# Patient Record
Sex: Male | Born: 1945
Health system: Southern US, Community
[De-identification: ages and names within clinical notes are randomized; demographics above are authoritative.]

## PROBLEM LIST (undated history)

## (undated) DIAGNOSIS — Z974 Presence of external hearing-aid: Secondary | ICD-10-CM

## (undated) DIAGNOSIS — N529 Male erectile dysfunction, unspecified: Secondary | ICD-10-CM

## (undated) DIAGNOSIS — M159 Polyosteoarthritis, unspecified: Secondary | ICD-10-CM

## (undated) DIAGNOSIS — R351 Nocturia: Secondary | ICD-10-CM

## (undated) DIAGNOSIS — N189 Chronic kidney disease, unspecified: Secondary | ICD-10-CM

## (undated) DIAGNOSIS — N400 Enlarged prostate without lower urinary tract symptoms: Secondary | ICD-10-CM

## (undated) DIAGNOSIS — Z973 Presence of spectacles and contact lenses: Secondary | ICD-10-CM

## (undated) DIAGNOSIS — K56609 Unspecified intestinal obstruction, unspecified as to partial versus complete obstruction: Secondary | ICD-10-CM

## (undated) DIAGNOSIS — Z8719 Personal history of other diseases of the digestive system: Secondary | ICD-10-CM

## (undated) DIAGNOSIS — G248 Other dystonia: Secondary | ICD-10-CM

## (undated) DIAGNOSIS — Z87442 Personal history of urinary calculi: Secondary | ICD-10-CM

## (undated) DIAGNOSIS — Z8551 Personal history of malignant neoplasm of bladder: Secondary | ICD-10-CM

## (undated) DIAGNOSIS — K5730 Diverticulosis of large intestine without perforation or abscess without bleeding: Secondary | ICD-10-CM

## (undated) DIAGNOSIS — D494 Neoplasm of unspecified behavior of bladder: Secondary | ICD-10-CM

## (undated) DIAGNOSIS — C801 Malignant (primary) neoplasm, unspecified: Secondary | ICD-10-CM

## (undated) DIAGNOSIS — E785 Hyperlipidemia, unspecified: Secondary | ICD-10-CM

## (undated) DIAGNOSIS — N3289 Other specified disorders of bladder: Secondary | ICD-10-CM

## (undated) DIAGNOSIS — K4090 Unilateral inguinal hernia, without obstruction or gangrene, not specified as recurrent: Secondary | ICD-10-CM

## (undated) HISTORY — DX: Unspecified intestinal obstruction, unspecified as to partial versus complete obstruction: K56.609

## (undated) HISTORY — DX: Malignant (primary) neoplasm, unspecified: C80.1

## (undated) HISTORY — DX: Male erectile dysfunction, unspecified: N52.9

## (undated) HISTORY — DX: Other dystonia: G24.8

## (undated) HISTORY — DX: Personal history of other diseases of the digestive system: Z87.19

## (undated) HISTORY — DX: Chronic kidney disease, unspecified: N18.9

---
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
## 1996-01-16 HISTORY — PX: PARTIAL COLECTOMY: SHX5273

## 2002-01-15 HISTORY — PX: GREEN LIGHT LASER TURP (TRANSURETHRAL RESECTION OF PROSTATE: SHX6260

## 2005-11-15 HISTORY — PX: EXPLORATORY LAPAROTOMY W/ BOWEL RESECTION: SHX1544

## 2011-03-16 HISTORY — PX: OTHER SURGICAL HISTORY: SHX169

## 2012-05-15 HISTORY — PX: TRANSURETHRAL RESECTION OF PROSTATE: SHX73

## 2013-01-29 ENCOUNTER — Ambulatory Visit: Payer: 59 | Admitting: Neurology

## 2013-02-10 ENCOUNTER — Encounter (INDEPENDENT_AMBULATORY_CARE_PROVIDER_SITE_OTHER): Payer: Self-pay

## 2013-02-10 ENCOUNTER — Ambulatory Visit (INDEPENDENT_AMBULATORY_CARE_PROVIDER_SITE_OTHER): Payer: 59 | Admitting: Neurology

## 2013-02-10 ENCOUNTER — Encounter: Payer: Self-pay | Admitting: Neurology

## 2013-02-10 VITALS — BP 135/80 | HR 67 | Temp 98.5°F | Ht 74.25 in | Wt 239.0 lb

## 2013-02-10 DIAGNOSIS — G2589 Other specified extrapyramidal and movement disorders: Secondary | ICD-10-CM

## 2013-02-10 DIAGNOSIS — G248 Other dystonia: Secondary | ICD-10-CM | POA: Insufficient documentation

## 2013-02-10 NOTE — Patient Instructions (Signed)
We will try to get records from Dr. Roger Shelter in [place]. Please fill out a release of healthcare information form today.  Once I have reviewed her records, we may add more testing, for example a brain MRI and a EEG which is a brain wave test. I may also want to add more blood work. For now, we will wait till we have records and we will not start any medication. Down the road, you may be a good candidate for focal botox injections, but I think, your condition is mild enough for watchful waiting at this point.

## 2013-02-10 NOTE — Progress Notes (Signed)
Subjective:    Patient ID: Douglas Holmes is a 68 y.o. male.  HPI   Star Age, MD, PhD [facility] [address] [place], [place] [address]  Dear Dr. Shelia Media,   I saw your patient, [name], upon your kind request in my neurologic clinic today for initial consultation of his abnormal hand position and concerned regarding progressive pronator drift. The patient is unaccompanied today. As you know, [name] is a very pleasant 68 year old right-handed gentleman with an underlying medical history of hyperlipidemia, hearing loss, osteoarthritis, nephrolithiasis, AAA, bladder cancer, BPH, erectile dysfunction, who has had a several year hx of over 5 years of gradual onset and slowly progressive abnormal R arm and hand dysfunction with abnormal positioning. No numbness, no paresthesias, no pain. He has noted, that he has to hold things at a different angle and his penmanship has changed.  Never had TIA or stroke symptoms, denying sudden onset of one sided weakness, numbness, tingling, slurring of speech or droopy face, hearing loss, tinnitus, diplopia or visual field cut or monocular loss of vision, and denies recurrent headaches. No FHx of stroke, no Wilson's, no Huntington's, no dystonia in the family either.  He moved from [place] to [facility] in July 2014 and had seen a Psychologist, sport and exercise and a neurologist in the past in [place], and was checked with [facility] for CTS, which was very mild and not surgical and the neurologist told him initially that he may have dystonia.   His Past Medical History Is Significant For: Past Medical History  Diagnosis Date  . Hypercholesteremia   . ED (erectile dysfunction)   . BPH (benign prostatic hyperplasia)   . Nephrolithiasis   . HOH (hard of hearing)   . Hx SBO   . History of diverticulitis   . Bladder cancer   . Other lesion of median nerve     Pronator syndrome  . Hypertrophy of prostate with urinary obstruction and other lower urinary  tract symptoms (LUTS)   . Generalized osteoarthrosis, unspecified site     His Past Surgical History Is Significant For: Past Surgical History  Procedure Laterality Date  . Cholectomy  1988  . Prostate surgery  05/2012    His Family History Is Significant For: Family History  Problem Relation Age of Onset  . Colon cancer      Family hx  . Dementia Father   . Cancer Mother   . Hypertension Mother   . Heart failure Brother   . Coronary artery disease Brother     His Social History Is Significant For: History   Social History  . Marital Status: Married    Spouse Name: N/A    Number of Children: N/A  . Years of Education: N/A   Social History Main Topics  . Smoking status: Former Research scientist (life sciences)  . Smokeless tobacco: None  . Alcohol Use: 1.5 oz/week    3 drink(s) per week  . Drug Use: No  . Sexual Activity: None   Other Topics Concern  . None   Social History Narrative  . None    His Allergies Are:  No Known Allergies:   His Current Medications Are:  Outpatient Encounter Prescriptions as of 02/10/2013  Medication Sig  . aspirin 81 MG tablet Take 81 mg by mouth daily.  Marland Kitchen atorvastatin (LIPITOR) 20 MG tablet Take 1 tablet by mouth daily.  Marland Kitchen glucosamine-chondroitin 500-400 MG tablet Take 1 tablet by mouth 3 (three) times daily.  Marland Kitchen KRILL OIL PO  Take 1 tablet by mouth daily.  . Multiple Vitamin (MULTIVITAMIN) tablet Take 1 tablet by mouth daily.  : Review of Systems:  Out of a complete 14 point review of systems, all are reviewed and negative with the exception of these symptoms as listed below:   Review of Systems  Constitutional: Negative.   HENT: Positive for hearing loss.   Eyes: Negative.   Respiratory: Negative.   Cardiovascular: Negative.   Gastrointestinal: Negative.   Endocrine: Negative.   Genitourinary: Negative.   Musculoskeletal: Negative.   Skin: Negative.   Allergic/Immunologic: Negative.   Neurological: Negative.   Hematological: Negative.    Psychiatric/Behavioral: Negative.     Objective:  Neurologic Exam  Physical Exam Physical Examination:   Filed Vitals:   02/10/13 1018  BP: 135/80  Pulse: 67  Temp: 98.5 F (36.9 C)    General Examination: The patient is a very pleasant 68 y.o. male in no acute distress. He appears well-developed and well-nourished and well groomed.   HEENT: Normocephalic, atraumatic, pupils are equal, round and reactive to light and accommodation. Funduscopic exam is normal with sharp disc margins noted. Extraocular tracking is good without limitation to gaze excursion or nystagmus noted. Normal smooth pursuit is noted. Hearing is grossly intact, but he states he has hearing aids which he did not bring today. Tympanic membranes are clear bilaterally. Face is symmetric with normal facial animation and normal facial sensation. Speech is clear with no dysarthria noted. There is no hypophonia. There is no lip, neck/head, jaw or voice tremor. Neck is supple with full range of passive and active motion. There are no carotid bruits on auscultation. Oropharynx exam reveals: mild mouth dryness, adequate dental hygiene and moderate airway crowding, due to thick soft palate and tonsillar size. Mallampati is class II. Tongue protrudes centrally and palate elevates symmetrically.   Chest: Clear to auscultation without wheezing, rhonchi or crackles noted.  Heart: S1+S2+0, regular and normal without murmurs, rubs or gallops noted.   Abdomen: Soft, non-tender and non-distended with normal bowel sounds appreciated on auscultation.  Extremities: There is no pitting edema in the distal lower extremities bilaterally. Pedal pulses are intact.  Skin: Warm and dry without trophic changes noted. There are no varicose veins.  Musculoskeletal: exam reveals no obvious joint deformities, tenderness or joint swelling or erythema.   Neurologically:  Mental status: The patient is awake, alert and oriented in all 4 spheres. His  memory, attention, language and Douglas are appropriate. There is no aphasia, agnosia, apraxia or anomia. Speech is clear with normal prosody and enunciation. Thought process is linear. Mood is congruent and affect is normal.  Cranial nerves are as described above under HEENT exam. In addition, shoulder shrug is normal with equal shoulder height noted. Motor exam: Normal bulk, strength and tone is noted, with the exception of a very slight increase in tone in the right wrist and right elbow area. He has slight difficulty with wrist extension on the right and with handwriting testing he has excessive risk flexion and finger flexion and abnormal positioning of his patent. There is a slight pronator drift, no tremor or rebound. There is no myoclonus, athetosis, or choreiform movements. Romberg is negative. Reflexes are 2+ throughout. Toes are downgoing bilaterally. Fine motor skills are intact with normal finger taps, normal hand movements, normal rapid alternating patting, normal foot taps and normal foot agility.  Cerebellar testing shows no dysmetria or intention tremor on finger to nose testing. Heel to shin is unremarkable bilaterally. There is  no truncal or gait ataxia.  Sensory exam is intact to light touch, pinprick, vibration, temperature sense in the upper and lower extremities.  Gait, station and balance are unremarkable. No veering to one side is noted. No leaning to one side is noted. Posture is age-appropriate and stance is narrow based. No problems turning are noted. He turns en bloc. Tandem walk is unremarkable. Intact toe and heel stance is noted.               Assessment and Plan:   In summary, Jamerion Cabello is a very pleasant 68 y.o.-year old male with a history of HLP, who reports an over five-year history of abnormal right arm and hand positioning, and change in posture and handwriting. 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 He  has otherwise a normal and nonfocal neurological in general exam. I reassured him in that regard. He has a very mild presentation and I think it is unlikely that he had a stroke or any other progressive neurological disorder. He does not have any family history of a movement disorder. Nevertheless, he would probably benefit from further testing in the form of brain MRI with contrast, EEG and blood work to include ceruloplasmin, copper level, thyroid function, RPR, ESR, CRP, ANA. However, he did have some blood work and some testing previously when he saw a neurologist in [place]. To that end, at this time, we will request records from Dr. Roger Shelter in [place], [place]. He will sign a release of healthcare information form on his way out today. I talked to him about focal dystonia at length today. I explained to him that there is no cure and oral symptomatic medications often cause side effects before they help for the focal problem. Considerations are benzodiazepines or a muscle relaxant. Localized Botox injection may be a good option for him down the road but at this time I think his symptomatology is quite mild so that watchful waiting may be the way to go at this time. Once I have reviewed his records I may add more testing and he can come in and get those tests done. I can see him back on an as-needed basis at this point. He was in agreement. I answered all his questions today and the patient was agreeable to the plan. Thank you very much for allowing me to participate in the care of this nice patient. If I can be of any further assistance to you please do not hesitate to call me at [phone].  Sincerely,   Star Age, MD, PhD

## 2013-04-21 DIAGNOSIS — Z8551 Personal history of malignant neoplasm of bladder: Secondary | ICD-10-CM | POA: Diagnosis not present

## 2013-04-21 DIAGNOSIS — R972 Elevated prostate specific antigen [PSA]: Secondary | ICD-10-CM | POA: Diagnosis not present

## 2013-04-21 DIAGNOSIS — N529 Male erectile dysfunction, unspecified: Secondary | ICD-10-CM | POA: Diagnosis not present

## 2013-07-21 DIAGNOSIS — R972 Elevated prostate specific antigen [PSA]: Secondary | ICD-10-CM | POA: Diagnosis not present

## 2013-07-28 DIAGNOSIS — Z8551 Personal history of malignant neoplasm of bladder: Secondary | ICD-10-CM | POA: Diagnosis not present

## 2013-07-28 DIAGNOSIS — R972 Elevated prostate specific antigen [PSA]: Secondary | ICD-10-CM | POA: Diagnosis not present

## 2013-08-21 DIAGNOSIS — Z0000 Encounter for general adult medical examination without abnormal findings: Secondary | ICD-10-CM | POA: Diagnosis not present

## 2013-08-21 DIAGNOSIS — E7800 Pure hypercholesterolemia, unspecified: Secondary | ICD-10-CM | POA: Diagnosis not present

## 2013-08-21 DIAGNOSIS — Z125 Encounter for screening for malignant neoplasm of prostate: Secondary | ICD-10-CM | POA: Diagnosis not present

## 2013-08-21 DIAGNOSIS — N400 Enlarged prostate without lower urinary tract symptoms: Secondary | ICD-10-CM | POA: Diagnosis not present

## 2013-08-27 DIAGNOSIS — N200 Calculus of kidney: Secondary | ICD-10-CM | POA: Diagnosis not present

## 2013-08-27 DIAGNOSIS — M159 Polyosteoarthritis, unspecified: Secondary | ICD-10-CM | POA: Diagnosis not present

## 2013-08-27 DIAGNOSIS — N529 Male erectile dysfunction, unspecified: Secondary | ICD-10-CM | POA: Diagnosis not present

## 2013-08-27 DIAGNOSIS — E7800 Pure hypercholesterolemia, unspecified: Secondary | ICD-10-CM | POA: Diagnosis not present

## 2013-09-07 DIAGNOSIS — Z23 Encounter for immunization: Secondary | ICD-10-CM | POA: Diagnosis not present

## 2013-11-03 DIAGNOSIS — C679 Malignant neoplasm of bladder, unspecified: Secondary | ICD-10-CM | POA: Diagnosis not present

## 2013-11-03 DIAGNOSIS — R351 Nocturia: Secondary | ICD-10-CM | POA: Diagnosis not present

## 2013-11-04 ENCOUNTER — Other Ambulatory Visit: Payer: Self-pay | Admitting: Urology

## 2013-11-16 NOTE — H&P (Signed)
Douglas Holmes is a 68 year old male with a history of bladder cancer.   History of Present Illness         History of bladder cancer: He was incidentally noted to have a bladder tumor at the time of the TURP. TURBT - 5/14. Pathology: Papillary, single (Ta,G1).     BPH with outlet obstruction: He was managed initially with maximum medical management using Proscar and Uroxatral. He underwent interstitial laser coagulation in '04. He underwent a TURP in 5/14 which resulted in significant improvement in his voiding symptoms.     History of nephrolithiasis: He has a history of calculus disease and underwent ureteroscopy for a left distal ureteral stone in 3/13. 24-hour urine - elevated urine uric acid, sodium and low volume.     Erectile dysfunction: This has been managed with Viagra 100 mg.     Rising PSA: His PSA increased from 0.9 in 2013 to 2.4 in 1/15. A rise of 1.5 (~0.75/year).     Interval history: He reports that he has been having some nocturia but otherwise is not having any new voiding complaints. His nocturia is not significant enough that he would want to try any form of medication for this.   Past Medical History Problems  1. History of hypercholesterolemia (Z86.39) 2. History of renal calculi (Z87.442) 3. Malignant neoplasm of urinary bladder (C67.9)  Surgical History Problems  1. History of Colotomy 2. History of Intestinal Surgery 3. History of Laser Coagulation Of Prostate 4. Prostate Surgery 5. History of Transurethral Resection Of Prostate (TURP)  Current Meds 1. Aspirin 81 MG Oral Tablet;  Therapy: (Recorded:05Jan2015) to Recorded 2. Glucosamine TABS;  Therapy: (Recorded:05Jan2015) to Recorded 3. Lipitor 20 MG Oral Tablet;  Therapy: (Recorded:05Jan2015) to Recorded 4. Multi-Vitamin TABS;  Therapy: (Recorded:05Jan2015) to Recorded 5. Omega-3 Krill Oil CAPS;  Therapy: (Recorded:05Jan2015) to Recorded 6. Vitamin D3 CAPS;  Therapy:  (Recorded:05Jan2015) to Recorded  Allergies Medication  1. No Known Drug Allergies  Family History Problems  1. Family history of CAD (coronary artery disease) : Brother 2. Family history of Deceased : Father, Mother 48. Family history of malignant neoplasm (Z80.9) : Mother  Social History Problems  1. Alcohol use   occasional 1 drink  three a week 2. Caffeine use (F15.90)   1-2 cups a day 3. Former smoker [phone]) 4. Married 5. Number of children   2 sons and 1 daughter 31. Retired  Review of Systems Genitourinary, constitutional, skin, eye, otolaryngeal, hematologic/lymphatic, cardiovascular, pulmonary, endocrine, musculoskeletal, gastrointestinal, neurological and psychiatric system(s) were reviewed and pertinent findings if present are noted.    Vitals Vital Signs Height: 6 ft 2 in Weight: 240 lb  BMI Calculated: 30.81 BSA Calculated: 2.35 Blood Pressure: 120 / 77 Temperature: 97.8 F Heart Rate: 71  Physical Exam Constitutional: Well nourished and well developed . No acute distress.  ENT:. The ears and nose are normal in appearance.  Neck: The appearance of the neck is normal and no neck mass is present.  Pulmonary: No respiratory distress and normal respiratory rhythm and effort.  Cardiovascular: Heart rate and rhythm are normal . No peripheral edema.  Abdomen: The abdomen is soft and nontender. No masses are palpated. No CVA tenderness. No hernias are palpable. No hepatosplenomegaly noted.  Rectal: Rectal exam demonstrates normal sphincter tone, no tenderness and no masses. The prostate has no nodularity and is not tender. The left seminal vesicle is nonpalpable. The right seminal vesicle is nonpalpable. The perineum is normal on inspection.  Genitourinary: Examination of  the penis demonstrates no discharge, no masses, no lesions and a normal meatus. The scrotum is without lesions. The right epididymis is palpably normal and non-tender. The left epididymis is  palpably normal and non-tender. The right testis is non-tender and without masses. The left testis is non-tender and without masses.  Lymphatics: The femoral and inguinal nodes are not enlarged or tender.  Skin: Normal skin turgor, no visible rash and no visible skin lesions.  Neuro/Psych:. Mood and affect are appropriate.      Procedure: Cystoscopy   Indication: History of Urothelial Carcinoma.  Informed Consent: Risks, benefits, and potential adverse events were discussed and informed consent was obtained from the patient.  Prep: The patient was prepped with betadine.  Anesthesia:. Local anesthesia was administered intraurethrally with 2% lidocaine jelly.  Procedure Note:  Urethral meatus:. No abnormalities.  Anterior urethra: No abnormalities.  Prostatic urethra:. There is evidence of resection with no bladder neck contracture but there was still some prostatic tissue present near the apex.  Bladder: Visulization was clear. The ureteral orifices were in the normal anatomic position bilaterally and had clear efflux of urine. Examination of the bladder demonstrated moderate trabeculation cellules. A solitary tumor was visualized in the bladder. A papillary tumor was seen in the bladder. This tumor was located on the right side, at the base of the bladder. The patient tolerated the procedure well.  Complications: None.      A papillary recurrence was noted on the floor of the bladder on the right-hand side posterior to the ureteral orifice. We discussed the need for resection of the tumor and I have gone over the procedure again with him including its risks and complications, the probability of success, the outpatient nature of the procedure as well as the anticipated postoperative course. He understands and has elected to proceed.        Plan: He is scheduled for transurethral resection of his bladder tumor.

## 2013-11-19 ENCOUNTER — Encounter (HOSPITAL_BASED_OUTPATIENT_CLINIC_OR_DEPARTMENT_OTHER): Payer: Self-pay | Admitting: *Deleted

## 2013-11-19 NOTE — Anesthesia Preprocedure Evaluation (Addendum)
Anesthesia Evaluation  Patient identified by MRN, date of birth, ID band Patient awake    Reviewed: Allergy & Precautions, H&P , NPO status , Patient's Chart, lab work & pertinent test results  History of Anesthesia Complications Negative for: history of anesthetic complications  Airway Mallampati: II  TM Distance: >3 FB Neck ROM: Full    Dental no notable dental hx. (+) Teeth Intact, Dental Advisory Given   Pulmonary former smoker,  breath sounds clear to auscultation  Pulmonary exam normal       Cardiovascular Exercise Tolerance: Good negative cardio ROS  Rhythm:Regular Rate:Normal     Neuro/Psych negative neurological ROS  negative psych ROS   GI/Hepatic negative GI ROS, Neg liver ROS,   Endo/Other  negative endocrine ROS  Renal/GU negative Renal ROS Bladder dysfunction      Musculoskeletal  (+) Arthritis -, Osteoarthritis,    Abdominal   Peds negative pediatric ROS (+)  Hematology negative hematology ROS (+)   Anesthesia Other Findings   Reproductive/Obstetrics negative OB ROS                            Anesthesia Physical Anesthesia Plan  ASA: II  Anesthesia Plan: General   Post-op Pain Management:    Induction: Intravenous  Airway Management Planned: LMA  Additional Equipment:   Intra-op Plan:   Post-operative Plan: Extubation in OR  Informed Consent: I have reviewed the patients History and Physical, chart, labs and discussed the procedure including the risks, benefits and alternatives for the proposed anesthesia with the patient or authorized representative who has indicated his/her understanding and acceptance.   Dental advisory given  Plan Discussed with: CRNA  Anesthesia Plan Comments:         Anesthesia Quick Evaluation

## 2013-11-19 NOTE — Progress Notes (Signed)
NPO AFTER MN.  ARRIVE AT 0830.  NEEDS HG. 

## 2013-11-20 ENCOUNTER — Ambulatory Visit (HOSPITAL_BASED_OUTPATIENT_CLINIC_OR_DEPARTMENT_OTHER): Payer: Medicare Other | Admitting: Anesthesiology

## 2013-11-20 ENCOUNTER — Encounter (HOSPITAL_BASED_OUTPATIENT_CLINIC_OR_DEPARTMENT_OTHER): Payer: Self-pay | Admitting: *Deleted

## 2013-11-20 ENCOUNTER — Ambulatory Visit (HOSPITAL_BASED_OUTPATIENT_CLINIC_OR_DEPARTMENT_OTHER)
Admission: RE | Admit: 2013-11-20 | Discharge: 2013-11-20 | Disposition: A | Discharge status: Home / Self Care | Payer: Medicare Other | Source: Ambulatory Visit | Attending: Urology | Admitting: Urology

## 2013-11-20 ENCOUNTER — Encounter (HOSPITAL_BASED_OUTPATIENT_CLINIC_OR_DEPARTMENT_OTHER)
Admission: RE | Disposition: A | Discharge status: Home / Self Care | Payer: Self-pay | Source: Ambulatory Visit | Attending: Urology

## 2013-11-20 DIAGNOSIS — N401 Enlarged prostate with lower urinary tract symptoms: Secondary | ICD-10-CM | POA: Diagnosis not present

## 2013-11-20 DIAGNOSIS — M1990 Unspecified osteoarthritis, unspecified site: Secondary | ICD-10-CM | POA: Diagnosis not present

## 2013-11-20 DIAGNOSIS — Z87891 Personal history of nicotine dependence: Secondary | ICD-10-CM | POA: Diagnosis not present

## 2013-11-20 DIAGNOSIS — C679 Malignant neoplasm of bladder, unspecified: Secondary | ICD-10-CM | POA: Insufficient documentation

## 2013-11-20 DIAGNOSIS — N138 Other obstructive and reflux uropathy: Secondary | ICD-10-CM | POA: Diagnosis not present

## 2013-11-20 DIAGNOSIS — E780 Pure hypercholesterolemia: Secondary | ICD-10-CM | POA: Insufficient documentation

## 2013-11-20 DIAGNOSIS — D494 Neoplasm of unspecified behavior of bladder: Secondary | ICD-10-CM | POA: Diagnosis present

## 2013-11-20 HISTORY — DX: Polyosteoarthritis, unspecified: M15.9

## 2013-11-20 HISTORY — DX: Presence of external hearing-aid: Z97.4

## 2013-11-20 HISTORY — PX: TRANSURETHRAL RESECTION OF BLADDER TUMOR: SHX2575

## 2013-11-20 HISTORY — DX: Benign prostatic hyperplasia without lower urinary tract symptoms: N40.0

## 2013-11-20 HISTORY — DX: Neoplasm of unspecified behavior of bladder: D49.4

## 2013-11-20 HISTORY — DX: Hyperlipidemia, unspecified: E78.5

## 2013-11-20 HISTORY — DX: Personal history of malignant neoplasm of bladder: Z85.51

## 2013-11-20 HISTORY — DX: Diverticulosis of large intestine without perforation or abscess without bleeding: K57.30

## 2013-11-20 HISTORY — DX: Personal history of urinary calculi: Z87.442

## 2013-11-20 LAB — POCT HEMOGLOBIN-HEMACUE: Hemoglobin: 15 g/dL (ref 13.0–17.0)

## 2013-11-20 SURGERY — TURBT (TRANSURETHRAL RESECTION OF BLADDER TUMOR)
Anesthesia: General | Site: Bladder

## 2013-11-20 MED ORDER — STERILE WATER FOR IRRIGATION IR SOLN
Status: DC | PRN
  Administered 2013-11-20: 500 mL via INTRAVESICAL

## 2013-11-20 MED ORDER — SODIUM CHLORIDE 0.9 % IR SOLN
Status: DC | PRN
  Administered 2013-11-20: 6000 mL via INTRAVESICAL

## 2013-11-20 MED ORDER — CIPROFLOXACIN IN D5W 200 MG/100ML IV SOLN
200.0000 mg | INTRAVENOUS | Status: AC
  Administered 2013-11-20: 200 mg via INTRAVENOUS
  Filled 2013-11-20: qty 100

## 2013-11-20 MED ORDER — ONDANSETRON HCL 4 MG/2ML IJ SOLN
4.0000 mg | Freq: Once | INTRAMUSCULAR | Status: DC | PRN
  Filled 2013-11-20: qty 2

## 2013-11-20 MED ORDER — FENTANYL CITRATE 0.05 MG/ML IJ SOLN
25.0000 ug | INTRAMUSCULAR | Status: DC | PRN
  Filled 2013-11-20: qty 1

## 2013-11-20 MED ORDER — PROPOFOL 10 MG/ML IV BOLUS
INTRAVENOUS | Status: DC | PRN
  Administered 2013-11-20: 200 mg via INTRAVENOUS

## 2013-11-20 MED ORDER — LIDOCAINE HCL (CARDIAC) 20 MG/ML IV SOLN
INTRAVENOUS | Status: DC | PRN
  Administered 2013-11-20: 50 mg via INTRAVENOUS

## 2013-11-20 MED ORDER — PHENAZOPYRIDINE HCL 200 MG PO TABS: 200.0000 mg | ORAL_TABLET | Freq: Three times a day (TID) | ORAL | Status: DC | PRN

## 2013-11-20 MED ORDER — CIPROFLOXACIN IN D5W 200 MG/100ML IV SOLN
INTRAVENOUS | Status: AC
  Filled 2013-11-20: qty 100

## 2013-11-20 MED ORDER — FENTANYL CITRATE 0.05 MG/ML IJ SOLN
INTRAMUSCULAR | Status: AC
  Filled 2013-11-20: qty 4

## 2013-11-20 MED ORDER — PHENAZOPYRIDINE HCL 200 MG PO TABS
200.0000 mg | ORAL_TABLET | Freq: Once | ORAL | Status: AC
  Administered 2013-11-20: 200 mg via ORAL
  Filled 2013-11-20: qty 1

## 2013-11-20 MED ORDER — HYDROCODONE-ACETAMINOPHEN 7.5-325 MG PO TABS: 1.0000 | ORAL_TABLET | ORAL | Status: DC | PRN

## 2013-11-20 MED ORDER — LACTATED RINGERS IV SOLN
INTRAVENOUS | Status: DC
Start: 2013-11-20 — End: 2013-11-20
  Administered 2013-11-20: 09:00:00 via INTRAVENOUS
  Filled 2013-11-20: qty 1000

## 2013-11-20 MED ORDER — MIDAZOLAM HCL 2 MG/2ML IJ SOLN
INTRAMUSCULAR | Status: AC
  Filled 2013-11-20: qty 2

## 2013-11-20 MED ORDER — FENTANYL CITRATE 0.05 MG/ML IJ SOLN
INTRAMUSCULAR | Status: DC | PRN
  Administered 2013-11-20: 100 ug via INTRAVENOUS

## 2013-11-20 MED ORDER — DEXAMETHASONE SODIUM PHOSPHATE 4 MG/ML IJ SOLN
INTRAMUSCULAR | Status: DC | PRN
  Administered 2013-11-20: 10 mg via INTRAVENOUS

## 2013-11-20 MED ORDER — PHENAZOPYRIDINE HCL 100 MG PO TABS
ORAL_TABLET | ORAL | Status: AC
  Filled 2013-11-20: qty 2

## 2013-11-20 MED ORDER — MIDAZOLAM HCL 5 MG/5ML IJ SOLN
INTRAMUSCULAR | Status: DC | PRN
  Administered 2013-11-20: 2 mg via INTRAVENOUS

## 2013-11-20 SURGICAL SUPPLY — 34 items
BAG DRAIN URO-CYSTO SKYTR STRL (DRAIN) ×2 IMPLANT
BAG URINE DRAINAGE (UROLOGICAL SUPPLIES) IMPLANT
BAG URINE LEG 19OZ MD ST LTX (BAG) IMPLANT
CANISTER SUCT LVC 12 LTR MEDI- (MISCELLANEOUS) ×2 IMPLANT
CATH FOLEY 2WAY SLVR  5CC 20FR (CATHETERS)
CATH FOLEY 2WAY SLVR  5CC 22FR (CATHETERS)
CATH FOLEY 2WAY SLVR  5CC 24FR (CATHETERS)
CATH FOLEY 2WAY SLVR 5CC 20FR (CATHETERS) IMPLANT
CATH FOLEY 2WAY SLVR 5CC 22FR (CATHETERS) IMPLANT
CATH FOLEY 2WAY SLVR 5CC 24FR (CATHETERS) IMPLANT
CATH FOLEY 3WAY 20FR (CATHETERS) IMPLANT
CLOTH BEACON ORANGE TIMEOUT ST (SAFETY) ×2 IMPLANT
DRAPE CAMERA CLOSED 9X96 (DRAPES) ×2 IMPLANT
ELECT BUTTON HF 24-28F 2 30DE (ELECTRODE) IMPLANT
ELECT LOOP MED HF 24F 12D (CUTTING LOOP) IMPLANT
ELECT REM PT RETURN 9FT ADLT (ELECTROSURGICAL) ×2
ELECT RESECT VAPORIZE 12D CBL (ELECTRODE) IMPLANT
ELECTRODE REM PT RTRN 9FT ADLT (ELECTROSURGICAL) ×1 IMPLANT
EVACUATOR MICROVAS BLADDER (UROLOGICAL SUPPLIES) IMPLANT
GLOVE BIO SURGEON STRL SZ 6.5 (GLOVE) ×2 IMPLANT
GLOVE BIO SURGEON STRL SZ8 (GLOVE) ×2 IMPLANT
GLOVE INDICATOR 7.0 STRL GRN (GLOVE) ×2 IMPLANT
GOWN PREVENTION PLUS LG XLONG (DISPOSABLE) IMPLANT
GOWN STRL REIN XL XLG (GOWN DISPOSABLE) IMPLANT
GOWN STRL REUS W/ TWL LRG LVL3 (GOWN DISPOSABLE) ×1 IMPLANT
GOWN STRL REUS W/ TWL XL LVL3 (GOWN DISPOSABLE) ×1 IMPLANT
GOWN STRL REUS W/TWL LRG LVL3 (GOWN DISPOSABLE) ×1
GOWN STRL REUS W/TWL XL LVL3 (GOWN DISPOSABLE) ×1
HOLDER FOLEY CATH W/STRAP (MISCELLANEOUS) IMPLANT
IV NS IRRIG 3000ML ARTHROMATIC (IV SOLUTION) ×4 IMPLANT
PACK CYSTO (CUSTOM PROCEDURE TRAY) ×2 IMPLANT
PLUG CATH AND CAP STER (CATHETERS) IMPLANT
SET ASPIRATION TUBING (TUBING) IMPLANT
WATER STERILE IRR 3000ML UROMA (IV SOLUTION) IMPLANT

## 2013-11-20 NOTE — Anesthesia Procedure Notes (Signed)
Procedure Name: LMA Insertion Date/Time: 11/20/2013 9:58 AM Performed by: Bethena Roys T Pre-anesthesia Checklist: Patient identified, Emergency Drugs available, Suction available and Patient being monitored Patient Re-evaluated:Patient Re-evaluated prior to inductionOxygen Delivery Method: Circle System Utilized Preoxygenation: Pre-oxygenation with 100% oxygen Intubation Type: IV induction Ventilation: Mask ventilation without difficulty LMA: LMA inserted LMA Size: 5.0 Number of attempts: 1 Airway Equipment and Method: bite block Placement Confirmation: positive ETCO2 Tube secured with: Tape Dental Injury: Teeth and Oropharynx as per pre-operative assessment

## 2013-11-20 NOTE — H&P (Signed)
Douglas Holmes is a 68 year old male with a history of bladder cancer.   History of Present Illness         History of bladder cancer: He was incidentally noted to have a bladder tumor at the time of the TURP. TURBT - 5/14. Pathology: Papillary, single (Ta,G1).     BPH with outlet obstruction: He was managed initially with maximum medical management using Proscar and Uroxatral. He underwent interstitial laser coagulation in '04. He underwent a TURP in 5/14 which resulted in significant improvement in his voiding symptoms.     History of nephrolithiasis: He has a history of calculus disease and underwent ureteroscopy for a left distal ureteral stone in 3/13. 24-hour urine - elevated urine uric acid, sodium and low volume.     Erectile dysfunction: This has been managed with Viagra 100 mg.     Rising PSA: His PSA increased from 0.9 in 2013 to 2.4 in 1/15. A rise of 1.5 (~0.75/year).     Interval history: He reports that he has been having some nocturia but otherwise is not having any new voiding complaints. His nocturia is not significant enough that he would want to try any form of medication for this.   Past Medical History Problems  1. History of hypercholesterolemia (Z86.39) 2. History of renal calculi (Z87.442) 3. Malignant neoplasm of urinary bladder (C67.9)  Surgical History Problems  1. History of Colotomy 2. History of Intestinal Surgery 3. History of Laser Coagulation Of Prostate 4. Prostate Surgery 5. History of Transurethral Resection Of Prostate (TURP)  Current Meds 1. Aspirin 81 MG Oral Tablet;  Therapy: (Recorded:05Jan2015) to Recorded 2. Glucosamine TABS;  Therapy: (Recorded:05Jan2015) to Recorded 3. Lipitor 20 MG Oral Tablet;  Therapy: (Recorded:05Jan2015) to Recorded 4. Multi-Vitamin TABS;  Therapy: (Recorded:05Jan2015) to Recorded 5. Omega-3 Krill Oil CAPS;  Therapy: (Recorded:05Jan2015) to Recorded 6. Vitamin D3 CAPS;  Therapy:  (Recorded:05Jan2015) to Recorded  Allergies Medication  1. No Known Drug Allergies  Family History Problems  1. Family history of CAD (coronary artery disease) : Brother 2. Family history of Deceased : Father, Mother 51. Family history of malignant neoplasm (Z80.9) : Mother  Social History Problems  1. Alcohol use   occasional 1 drink  three a week 2. Caffeine use (F15.90)   1-2 cups a day 3. Former smoker [phone]) 4. Married 5. Number of children   2 sons and 1 daughter 16. Retired  Review of Systems Genitourinary, constitutional, skin, eye, otolaryngeal, hematologic/lymphatic, cardiovascular, pulmonary, endocrine, musculoskeletal, gastrointestinal, neurological and psychiatric system(s) were reviewed and pertinent findings if present are noted.    Vitals Vital Signs Height: 6 ft 2 in Weight: 240 lb  BMI Calculated: 30.81 BSA Calculated: 2.35 Blood Pressure: 96 / 60 Temperature: 98.3 F Heart Rate: 77  Physical Exam Constitutional: Well nourished and well developed . No acute distress.  ENT:. The ears and nose are normal in appearance.  Neck: The appearance of the neck is normal and no neck mass is present.  Pulmonary: No respiratory distress and normal respiratory rhythm and effort.  Cardiovascular: Heart rate and rhythm are normal . No peripheral edema.  Abdomen: The abdomen is soft and nontender. No masses are palpated. No CVA tenderness. No hernias are palpable. No hepatosplenomegaly noted.  Rectal: Rectal exam demonstrates normal sphincter tone, no tenderness and no masses. The prostate has no nodularity and is not tender. The left seminal vesicle is nonpalpable. The right seminal vesicle is nonpalpable. The perineum is normal on inspection.  Genitourinary: Examination  of the penis demonstrates no discharge, no masses, no lesions and a normal meatus. The scrotum is without lesions. The right epididymis is palpably normal and non-tender. The left epididymis is  palpably normal and non-tender. The right testis is non-tender and without masses. The left testis is non-tender and without masses.  Lymphatics: The femoral and inguinal nodes are not enlarged or tender.  Skin: Normal skin turgor, no visible rash and no visible skin lesions.  Neuro/Psych:. Mood and affect are appropriate.   Procedure  Procedure: Cystoscopy done 11/03/13  Indication: History of Urothelial Carcinoma.  Informed Consent: Risks, benefits, and potential adverse events were discussed and informed consent was obtained from the patient.  Prep: The patient was prepped with betadine.  Anesthesia:. Local anesthesia was administered intraurethrally with 2% lidocaine jelly.  Procedure Note:  Urethral meatus:. No abnormalities.  Anterior urethra: No abnormalities.  Prostatic urethra:. There is evidence of resection with no bladder neck contracture but there was still some prostatic tissue present near the apex.  Bladder: Visulization was clear. The ureteral orifices were in the normal anatomic position bilaterally and had clear efflux of urine. Examination of the bladder demonstrated moderate trabeculation cellules. A solitary tumor was visualized in the bladder. A papillary tumor was seen in the bladder. This tumor was located on the right side, at the base of the bladder. The patient tolerated the procedure well.  Complications: None.    Assessment  A papillary recurrence was noted on the floor of the bladder on the right-hand side posterior to the ureteral orifice. We discussed the need for resection of the tumor and I have gone over the procedure again with him including its risks and complications, the probability of success, the outpatient nature of the procedure as well as the anticipated postoperative course. He understands and has elected to proceed.     Plan  He will be scheduled for transurethral resection of his bladder tumor.

## 2013-11-20 NOTE — Op Note (Signed)
PATIENT:  Douglas Holmes  PRE-OPERATIVE DIAGNOSIS: Bladder tumor  POST-OPERATIVE DIAGNOSIS: Same  PROCEDURE:  Procedure(s): TRANSURETHRAL RESECTION OF BLADDER TUMOR (TURBT) (0.5cm.)  SURGEON:  Surgeon(s): Claybon Jabs  ANESTHESIA:   General  EBL:  none  DRAINS: none  SPECIMEN:  Source of Specimen:  Bladder tumor  DISPOSITION OF SPECIMEN:  PATHOLOGY  Indication: [name] is a 68 year old malewith a history of transitional cell carcinoma of the bladder. His original pathology was TA, G1 at the time of resection in 5/14. He presented for routine surveillance cystoscopy and was found to have a recurrence. The recurrence was papillary and appeared to be located very close to the right ureteral orifice. The concern was that office fulguration might result in some scarring and compromise of the ureteral orifice and therefore he is brought to the operating room for a more controlled removal of the recurrence.  Description of operation: The patient was taken to the operating room and administered general anesthesia. He was then placed on the table and moved to the dorsal lithotomy position after which his genitalia was sterilely prepped and draped. An official timeout was then performed.  The 28 French cystoscope was passed under direct vision down the urethra which is noted be normal. The prostatic urethra was noted to be slightly elongated with bilobar hypertrophy but no lesions were noted. Upon entering the bladder it was fully and systematically inspected. Ureteral orifices were noted to be of normal configuration and position. A single papillary tumor was identified and as I filled the bladder more that I had in the office the tumor moved away from the right ureteral orifice and was noted to be located well away from the orifice on the floor of the bladder behind the orifice on the right side.  The cold cup biopsy forceps were then used to obtain a biopsy of the lesion. I then used the  Bugbee electrode to fulgurate the location and  The area around the biopsy site. The bladder was then drained and the patient was awakened and taken recovery room in stable and satisfactory condition. He tolerated procedure well with no intraoperative complications.  PLAN OF CARE: Discharge to home after PACU  PATIENT DISPOSITION:  PACU - hemodynamically stable.

## 2013-11-20 NOTE — Transfer of Care (Signed)
Immediate Anesthesia Transfer of Care Note  Patient: Douglas Holmes  Procedure(s) Performed: Procedure(s): TRANSURETHRAL RESECTION OF BLADDER TUMOR (TURBT) (N/A)  Patient Location: PACU  Anesthesia Type:General  Level of Consciousness: sedated and responds to stimulation  Airway & Oxygen Therapy: Patient Spontanous Breathing and Patient connected to nasal cannula oxygen  Post-op Assessment: Report given to PACU RN  Post vital signs: Reviewed and stable  Complications: No apparent anesthesia complications

## 2013-11-20 NOTE — Discharge Instructions (Signed)
Transurethral Resection of Bladder Tumor (TURBT)   Definition:  Transurethral Resection of the Bladder Tumor is a surgical procedure used to diagnose and remove tumors within the bladder. TURBT is the most common treatment for early stage bladder cancer.  General instructions:     Your recent bladder surgery requires very little post hospital care but some definite precautions.  Despite the fact that no skin incisions were used, the area around the bladder incisions are raw and covered with scabs to promote healing and prevent bleeding. Certain precautions are needed to insure that the scabs are not disturbed over the next 2-4 weeks while the healing proceeds.  Because the raw surface inside your bladder and the irritating effects of urine you may expect frequency of urination and/or urgency (a stronger desire to urinate) and perhaps even getting up at night more often. This will usually resolve or improve slowly over the healing period. You may see some blood in your urine over the first 6 weeks. Do not be alarmed, even if the urine was clear for a while. Get off your feet and drink lots of fluids until clearing occurs. If you start to pass clots or don't improve call us.  Catheter: (If you are discharged with a catheter.)  1. Keep your catheter secured to your leg at all times with tape or the supplied strap. 2. You may experience leakage of urine around your catheter- as long as the  catheter continues to drain, this is normal.  If your catheter stops draining  go to the ER. 3. You may also have blood in your urine, even after it has been clear for  several days; you may even pass some small blood clots or other material.  This  is normal as well.  If this happens, sit down and drink plenty of water to help  make urine to flush out your bladder.  If the blood in your urine becomes worse  after doing this, contact our office or return to the ER. 4. You may use the leg bag (small bag)  during the day, but use the large bag at  night.  Diet:  You may return to your normal diet immediately. Because of the raw surface of your bladder, alcohol, spicy foods, foods high in acid and drinks with caffeine may cause irritation or frequency and should be used in moderation. To keep your urine flowing freely and avoid constipation, drink plenty of fluids during the day (8-10 glasses). Tip: Avoid cranberry juice because it is very acidic.  Activity:  Your physical activity doesn't need to be restricted. However, if you are very active, you may see some blood in the urine. We suggest that you reduce your activity under the circumstances until the bleeding has stopped.  Bowels:  It is important to keep your bowels regular during the postoperative period. Straining with bowel movements can cause bleeding. A bowel movement every other day is reasonable. Use a mild laxative if needed, such as milk of magnesia 2-3 tablespoons, or 2 Dulcolax tablets. Call if you continue to have problems. If you had been taking narcotics for pain, before, during or after your surgery, you may be constipated. Take a laxative if necessary.    Medication:  You should resume your pre-surgery medications unless told not to. In addition you may be given an antibiotic to prevent or treat infection. Antibiotics are not always necessary. All medication should be taken as prescribed until the bottles are finished unless you are having  an unusual reaction to one of the drugs.    Post Anesthesia Home Care Instructions  Activity: Get plenty of rest for the remainder of the day. A responsible adult should stay with you for 24 hours following the procedure.  For the next 24 hours, DO NOT: -Drive a car -Operate machinery -Drink alcoholic beverages -Take any medication unless instructed by your physician -Make any legal decisions or sign important papers.  Meals: Start with liquid foods such as gelatin or soup.  Progress to regular foods as tolerated. Avoid greasy, spicy, heavy foods. If nausea and/or vomiting occur, drink only clear liquids until the nausea and/or vomiting subsides. Call your physician if vomiting continues.  Special Instructions/Symptoms: Your throat may feel dry or sore from the anesthesia or the breathing tube placed in your throat during surgery. If this causes discomfort, gargle with warm salt water. The discomfort should disappear within 24 hours.        

## 2013-11-21 NOTE — Anesthesia Postprocedure Evaluation (Signed)
  Anesthesia Post-op Note  Patient: Douglas Holmes  Procedure(s) Performed: Procedure(s) (LRB): TRANSURETHRAL RESECTION OF BLADDER TUMOR (TURBT) (N/A)  Patient Location: PACU  Anesthesia Type: General  Level of Consciousness: awake and alert   Airway and Oxygen Therapy: Patient Spontanous Breathing  Post-op Pain: mild  Post-op Assessment: Post-op Vital signs reviewed, Patient's Cardiovascular Status Stable, Respiratory Function Stable, Patent Airway and No signs of Nausea or vomiting  Last Vitals:  Filed Vitals:   11/20/13 1211  BP: 118/69  Pulse: 57  Temp: 36.5 C  Resp: 14    Post-op Vital Signs: stable   Complications: No apparent anesthesia complications

## 2013-11-24 ENCOUNTER — Encounter (HOSPITAL_BASED_OUTPATIENT_CLINIC_OR_DEPARTMENT_OTHER): Payer: Self-pay | Admitting: Urology

## 2014-01-15 HISTORY — PX: HAND SURGERY: SHX662

## 2014-02-22 DIAGNOSIS — C679 Malignant neoplasm of bladder, unspecified: Secondary | ICD-10-CM | POA: Diagnosis not present

## 2014-05-13 DIAGNOSIS — R972 Elevated prostate specific antigen [PSA]: Secondary | ICD-10-CM | POA: Diagnosis not present

## 2014-05-19 DIAGNOSIS — C675 Malignant neoplasm of bladder neck: Secondary | ICD-10-CM | POA: Diagnosis not present

## 2014-05-19 DIAGNOSIS — R972 Elevated prostate specific antigen [PSA]: Secondary | ICD-10-CM | POA: Diagnosis not present

## 2014-07-22 DIAGNOSIS — L821 Other seborrheic keratosis: Secondary | ICD-10-CM | POA: Diagnosis not present

## 2014-07-22 DIAGNOSIS — E780 Pure hypercholesterolemia: Secondary | ICD-10-CM | POA: Diagnosis not present

## 2014-09-16 DIAGNOSIS — N529 Male erectile dysfunction, unspecified: Secondary | ICD-10-CM | POA: Diagnosis not present

## 2014-09-16 DIAGNOSIS — Z8551 Personal history of malignant neoplasm of bladder: Secondary | ICD-10-CM | POA: Diagnosis not present

## 2014-09-17 DIAGNOSIS — L438 Other lichen planus: Secondary | ICD-10-CM | POA: Diagnosis not present

## 2014-09-17 DIAGNOSIS — D1801 Hemangioma of skin and subcutaneous tissue: Secondary | ICD-10-CM | POA: Diagnosis not present

## 2014-09-17 DIAGNOSIS — L720 Epidermal cyst: Secondary | ICD-10-CM | POA: Diagnosis not present

## 2014-09-17 DIAGNOSIS — L570 Actinic keratosis: Secondary | ICD-10-CM | POA: Diagnosis not present

## 2014-09-17 DIAGNOSIS — L918 Other hypertrophic disorders of the skin: Secondary | ICD-10-CM | POA: Diagnosis not present

## 2014-09-17 DIAGNOSIS — L821 Other seborrheic keratosis: Secondary | ICD-10-CM | POA: Diagnosis not present

## 2014-09-28 DIAGNOSIS — Z0000 Encounter for general adult medical examination without abnormal findings: Secondary | ICD-10-CM | POA: Diagnosis not present

## 2014-09-28 DIAGNOSIS — Z125 Encounter for screening for malignant neoplasm of prostate: Secondary | ICD-10-CM | POA: Diagnosis not present

## 2014-09-28 DIAGNOSIS — E780 Pure hypercholesterolemia: Secondary | ICD-10-CM | POA: Diagnosis not present

## 2014-09-29 DIAGNOSIS — Z7189 Other specified counseling: Secondary | ICD-10-CM | POA: Diagnosis not present

## 2014-09-29 DIAGNOSIS — Z23 Encounter for immunization: Secondary | ICD-10-CM | POA: Diagnosis not present

## 2014-09-29 DIAGNOSIS — Z8719 Personal history of other diseases of the digestive system: Secondary | ICD-10-CM | POA: Diagnosis not present

## 2014-09-29 DIAGNOSIS — E780 Pure hypercholesterolemia: Secondary | ICD-10-CM | POA: Diagnosis not present

## 2014-09-29 DIAGNOSIS — Z7982 Long term (current) use of aspirin: Secondary | ICD-10-CM | POA: Diagnosis not present

## 2014-11-07 ENCOUNTER — Encounter (HOSPITAL_COMMUNITY): Payer: Self-pay | Admitting: Emergency Medicine

## 2014-11-07 ENCOUNTER — Emergency Department (HOSPITAL_COMMUNITY)
Admission: EM | Admit: 2014-11-07 | Discharge: 2014-11-07 | Disposition: A | Discharge status: Home / Self Care | Payer: Medicare Other | Attending: Emergency Medicine | Admitting: Emergency Medicine

## 2014-11-07 DIAGNOSIS — Z7982 Long term (current) use of aspirin: Secondary | ICD-10-CM | POA: Diagnosis not present

## 2014-11-07 DIAGNOSIS — W228XXA Striking against or struck by other objects, initial encounter: Secondary | ICD-10-CM | POA: Diagnosis not present

## 2014-11-07 DIAGNOSIS — E785 Hyperlipidemia, unspecified: Secondary | ICD-10-CM | POA: Insufficient documentation

## 2014-11-07 DIAGNOSIS — Z87442 Personal history of urinary calculi: Secondary | ICD-10-CM | POA: Insufficient documentation

## 2014-11-07 DIAGNOSIS — Z87891 Personal history of nicotine dependence: Secondary | ICD-10-CM | POA: Insufficient documentation

## 2014-11-07 DIAGNOSIS — Y9389 Activity, other specified: Secondary | ICD-10-CM | POA: Insufficient documentation

## 2014-11-07 DIAGNOSIS — Z8551 Personal history of malignant neoplasm of bladder: Secondary | ICD-10-CM | POA: Insufficient documentation

## 2014-11-07 DIAGNOSIS — S61411A Laceration without foreign body of right hand, initial encounter: Secondary | ICD-10-CM | POA: Diagnosis not present

## 2014-11-07 DIAGNOSIS — Y998 Other external cause status: Secondary | ICD-10-CM | POA: Diagnosis not present

## 2014-11-07 DIAGNOSIS — Z23 Encounter for immunization: Secondary | ICD-10-CM | POA: Diagnosis not present

## 2014-11-07 DIAGNOSIS — Y9289 Other specified places as the place of occurrence of the external cause: Secondary | ICD-10-CM | POA: Insufficient documentation

## 2014-11-07 DIAGNOSIS — Z79899 Other long term (current) drug therapy: Secondary | ICD-10-CM | POA: Insufficient documentation

## 2014-11-07 MED ORDER — CEPHALEXIN 500 MG PO CAPS: 500.0000 mg | ORAL_CAPSULE | Freq: Four times a day (QID) | ORAL | Status: DC

## 2014-11-07 MED ORDER — TETANUS-DIPHTH-ACELL PERTUSSIS 5-2.5-18.5 LF-MCG/0.5 IM SUSP
0.5000 mL | Freq: Once | INTRAMUSCULAR | Status: AC
  Administered 2014-11-07: 0.5 mL via INTRAMUSCULAR
  Filled 2014-11-07: qty 0.5

## 2014-11-07 MED ORDER — ONDANSETRON HCL 4 MG/2ML IJ SOLN
4.0000 mg | Freq: Once | INTRAMUSCULAR | Status: AC
  Administered 2014-11-07: 4 mg via INTRAVENOUS
  Filled 2014-11-07: qty 2

## 2014-11-07 MED ORDER — LIDOCAINE-EPINEPHRINE (PF) 2 %-1:200000 IJ SOLN
10.0000 mL | Freq: Once | INTRAMUSCULAR | Status: AC
  Administered 2014-11-07: 10 mL via INTRADERMAL
  Filled 2014-11-07: qty 20

## 2014-11-07 MED ORDER — CEFAZOLIN SODIUM 1-5 GM-% IV SOLN
1.0000 g | Freq: Once | INTRAVENOUS | Status: AC
  Administered 2014-11-07: 1 g via INTRAVENOUS
  Filled 2014-11-07: qty 50

## 2014-11-07 MED ORDER — MORPHINE SULFATE (PF) 4 MG/ML IV SOLN
4.0000 mg | Freq: Once | INTRAVENOUS | Status: AC
  Administered 2014-11-07: 4 mg via INTRAVENOUS
  Filled 2014-11-07: qty 1

## 2014-11-07 MED ORDER — LIDOCAINE-EPINEPHRINE 2 %-1:100000 IJ SOLN
20.0000 mL | Freq: Once | INTRAMUSCULAR | Status: DC
  Filled 2014-11-07: qty 20

## 2014-11-07 MED ORDER — HYDROCODONE-ACETAMINOPHEN 7.5-325 MG PO TABS: 1.0000 | ORAL_TABLET | Freq: Four times a day (QID) | ORAL | Status: DC | PRN

## 2014-11-07 NOTE — Discharge Instructions (Signed)
You have a laceration to your right hand with muscle involvement and likely tendon or nerve involvement.  Please call and follow up with hand specialist early next week for further evaluation.  Your sutures will need to be remove in 7 days if no further treatment warranted.  Take antibiotic and pain medication as prescribed.  Laceration Care, Adult A laceration is a cut that goes through all of the layers of the skin and into the tissue that is right under the skin. Some lacerations heal on their own. Others need to be closed with stitches (sutures), staples, skin adhesive strips, or skin glue. Proper laceration care minimizes the risk of infection and helps the laceration to heal better. HOW TO CARE FOR YOUR LACERATION If sutures or staples were used:  Keep the wound clean and dry.  If you were given a bandage (dressing), you should change it at least one time per day or as told by your health care provider. You should also change it if it becomes wet or dirty.  Keep the wound completely dry for the first 24 hours or as told by your health care provider. After that time, you may shower or bathe. However, make sure that the wound is not soaked in water until after the sutures or staples have been removed.  Clean the wound one time each day or as told by your health care provider:  Wash the wound with soap and water.  Rinse the wound with water to remove all soap.  Pat the wound dry with a clean towel. Do not rub the wound.  After cleaning the wound, apply a thin layer of antibiotic ointmentas told by your health care provider. This will help to prevent infection and keep the dressing from sticking to the wound.  Have the sutures or staples removed as told by your health care provider. If skin adhesive strips were used:  Keep the wound clean and dry.  If you were given a bandage (dressing), you should change it at least one time per day or as told by your health care provider. You should  also change it if it becomes dirty or wet.  Do not get the skin adhesive strips wet. You may shower or bathe, but be careful to keep the wound dry.  If the wound gets wet, pat it dry with a clean towel. Do not rub the wound.  Skin adhesive strips fall off on their own. You may trim the strips as the wound heals. Do not remove skin adhesive strips that are still stuck to the wound. They will fall off in time. If skin glue was used:  Try to keep the wound dry, but you may briefly wet it in the shower or bath. Do not soak the wound in water, such as by swimming.  After you have showered or bathed, gently pat the wound dry with a clean towel. Do not rub the wound.  Do not do any activities that will make you sweat heavily until the skin glue has fallen off on its own.  Do not apply liquid, cream, or ointment medicine to the wound while the skin glue is in place. Using those may loosen the film before the wound has healed.  If you were given a bandage (dressing), you should change it at least one time per day or as told by your health care provider. You should also change it if it becomes dirty or wet.  If a dressing is placed over the wound,  be careful not to apply tape directly over the skin glue. Doing that may cause the glue to be pulled off before the wound has healed.  Do not pick at the glue. The skin glue usually remains in place for 5-10 days, then it falls off of the skin. General Instructions  Take over-the-counter and prescription medicines only as told by your health care provider.  If you were prescribed an antibiotic medicine or ointment, take or apply it as told by your doctor. Do not stop using it even if your condition improves.  To help prevent scarring, make sure to cover your wound with sunscreen whenever you are outside after stitches are removed, after adhesive strips are removed, or when glue remains in place and the wound is healed. Make sure to wear a sunscreen of at  least 30 SPF.  Do not scratch or pick at the wound.  Keep all follow-up visits as told by your health care provider. This is important.  Check your wound every day for signs of infection. Watch for:  Redness, swelling, or pain.  Fluid, blood, or pus.  Raise (elevate) the injured area above the level of your heart while you are sitting or lying down, if possible. SEEK MEDICAL CARE IF:  You received a tetanus shot and you have swelling, severe pain, redness, or bleeding at the injection site.  You have a fever.  A wound that was closed breaks open.  You notice a bad smell coming from your wound or your dressing.  You notice something coming out of the wound, such as wood or glass.  Your pain is not controlled with medicine.  You have increased redness, swelling, or pain at the site of your wound.  You have fluid, blood, or pus coming from your wound.  You notice a change in the color of your skin near your wound.  You need to change the dressing frequently due to fluid, blood, or pus draining from the wound.  You develop a new rash.  You develop numbness around the wound. SEEK IMMEDIATE MEDICAL CARE IF:  You develop severe swelling around the wound.  Your pain suddenly increases and is severe.  You develop painful lumps near the wound or on skin that is anywhere on your body.  You have a red streak going away from your wound.  The wound is on your hand or foot and you cannot properly move a finger or toe.  The wound is on your hand or foot and you notice that your fingers or toes look pale or bluish.   This information is not intended to replace advice given to you by your health care provider. Make sure you discuss any questions you have with your health care provider.   Document Released: 01/01/2005 Document Revised: 05/18/2014 Document Reviewed: 12/28/2013 Elsevier Interactive Patient Education [employer].

## 2014-11-07 NOTE — ED Notes (Signed)
Pt. Stated, he slipped off a retaining wall, foot slipped.  Laceration to left anterior palm of hand.

## 2014-11-07 NOTE — ED Provider Notes (Signed)
CSN: [id number]     Arrival date & time 11/07/14  1446 History   First MD Initiated Contact with Patient 11/07/14 1514     Chief Complaint  Patient presents with  . Hand Injury     (Consider location/radiation/quality/duration/timing/severity/associated sxs/prior Treatment) HPI   69 year old male presenting for evaluation of left hand laceration. Patient states prior to arrival he actually slipped off a retaining wall when his foot slipped and suffered laceration to the right  palm of hand as he extended out to break the fall. He denies hitting his head or loss of consciousness. Laceration is large, with associated sharp throbbing sensation to the affected site and mild tingling sensation to his pinky. He is unable to fully spread his pinky finger. No specific treatment tried. Unable to recall last tetanus status. He denies any wrist or elbow pain. He denies any other injury. He is not allergic to any medication. He is not on any blood thinning medication aside from baby aspirin daily. He is right-hand dominant.   Past Medical History  Diagnosis Date  . ED (erectile dysfunction)   . Hx SBO   . History of diverticulitis   . Focal dystonia NEUROLOGIST-  DR Star Age    RIGHT HAND AND ARM  . Hyperlipidemia   . History of kidney stones   . History of bladder cancer urologist--  dr Karsten Ro    05/ 2014  single papillary (Ta G1)  . Bladder tumor   . Diverticulosis of colon   . BPH (benign prostatic hypertrophy)   . Wears hearing aid     BILATERAL  . Generalized OA     FINGERS   Past Surgical History  Procedure Laterality Date  . Partial colectomy  1998    diverticulitis  . Exploratory laparotomy w/ bowel resection  11/ 2007    SBO  . Transurethral resection of prostate  5/ 2014    and Resection Bladder Tumor  . Green light laser turp (transurethral resection of prostate  2004  . Left ureteroscopic stone extraction  3/ 2013  . Transurethral resection of bladder tumor N/A  11/20/2013    Procedure: TRANSURETHRAL RESECTION OF BLADDER TUMOR (TURBT);  Surgeon: Claybon Jabs, MD;  Location: [facility];  Service: Urology;  Laterality: N/A;   Family History  Problem Relation Age of Onset  . Colon cancer      Family hx  . Dementia Father   . Cancer Mother   . Hypertension Mother   . Heart failure Brother   . Coronary artery disease Brother    Social History  Substance Use Topics  . Smoking status: Former Smoker -- 1.00 packs/day for 30 years    Types: Cigarettes    Quit date: 11/19/1993  . Smokeless tobacco: Never Used  . Alcohol Use: 4.2 oz/week    3 Standard drinks or equivalent, 4 Glasses of wine per week    Review of Systems  Constitutional: Negative for fever.  Skin: Positive for wound.  Neurological: Positive for numbness. Negative for headaches.      Allergies  Review of patient's allergies indicates no known allergies.  Home Medications   Prior to Admission medications   Medication Sig Start Date End Date Taking? Authorizing Provider  aspirin 81 MG tablet Take 81 mg by mouth daily.    Historical Provider, MD  atorvastatin (LIPITOR) 20 MG tablet Take 1 tablet by mouth every evening.  12/31/12   Historical Provider, MD  glucosamine-chondroitin 500-400 MG  tablet Take 1 tablet by mouth 3 (three) times daily.    Historical Provider, MD  HYDROcodone-acetaminophen (NORCO) 7.5-325 MG per tablet Take 1-2 tablets by mouth every 4 (four) hours as needed for moderate pain. Maximum dose per 24 hours - 8 pills 11/20/13   Kathie Rhodes, MD  KRILL OIL PO Take 1 tablet by mouth daily.    Historical Provider, MD  Multiple Vitamin (MULTIVITAMIN) tablet Take 1 tablet by mouth daily.    Historical Provider, MD  phenazopyridine (PYRIDIUM) 200 MG tablet Take 1 tablet (200 mg total) by mouth 3 (three) times daily as needed for pain. 11/20/13   Kathie Rhodes, MD   BP 133/81 mmHg  Pulse 87  Temp(Src) 97.5 F (36.4 C) (Oral)  Resp 16  Ht 6\' 2"  (1.88  m)  Wt 248 lb 4.8 oz (112.628 kg)  BMI 31.87 kg/m2  SpO2 96% Physical Exam  Constitutional: He appears well-developed and well-nourished. No distress.  HENT:  Head: Atraumatic.  Eyes: Conjunctivae are normal.  Neck: Neck supple.  Musculoskeletal: He exhibits tenderness (Right hand: Deep oblique laceration to palmar aspect involving the hypothenar eminence and decrease of the hand measuring 8 cm. Unable to fully abduct pinky. Decreased sensation to lateral aspects of right pinky).  Right wrist nontender, radial pulse 2+. Brisk cap refills to all fingers.  Neurological: He is alert.  Skin: No rash noted.  Psychiatric: He has a normal mood and affect.  Nursing note and vitals reviewed.   ED Course  .Marland KitchenLaceration Repair Date/Time: 11/07/2014 5:04 PM Performed by: Domenic Moras Authorized by: Domenic Moras Consent: Verbal consent obtained. Risks and benefits: risks, benefits and alternatives were discussed Consent given by: patient Patient understanding: patient states understanding of the procedure being performed Patient consent: the patient's understanding of the procedure matches consent given Patient identity confirmed: verbally with patient and arm band Time out: Immediately prior to procedure a "time out" was called to verify the correct patient, procedure, equipment, support staff and site/side marked as required. Body area: upper extremity Location details: right hand Laceration length: 8 cm Contamination: The wound is contaminated. Foreign bodies: unknown Tendon involvement: complex Nerve involvement: complex Vascular damage: yes Anesthesia: local infiltration Local anesthetic: lidocaine 2% with epinephrine Anesthetic total: 6 ml Patient sedated: no Preparation: Patient was prepped and draped in the usual sterile fashion. Irrigation solution: saline Irrigation method: syringe Amount of cleaning: extensive Debridement: moderate Degree of undermining: minimal Skin  closure: 5-0 Prolene Number of sutures: 10 Technique: simple Approximation: loose Approximation difficulty: simple Dressing: gauze roll, 4x4 sterile gauze and antibiotic ointment Patient tolerance: Patient tolerated the procedure well with no immediate complications   (including critical care time)        3:57 PM Patient had a mechanical fall suffered a deep laceration to his right dominant hand affecting the palmar aspect. It appears to involve both nerve and tendon as patient is unable to abduct his right pinky and also having decreased sensation to the lateral aspects of right pinky finger. Wound is contaminated with ground debris. I discussed with Dr. Wilson Singer who recommend for the wound to be closed and patient will need to follow-up with hand specialist next week for further management.  5:04 PM Laceration was repaired by me. i have consulted with hand specialist Dr. Lenon Curt who agrees that pt can be seen in office for this. Pt will f/u with Dr. Lenon Curt early next week for reevaluation and possibly further tendon repair if necessary.  No obvious tendon injury noted on  initial exam, but evidence of deep laceration to belly of hypothenar muscle, and fascia, along with potential ulnar nerve injury.  Ancef abx and tdap given.       MDM   Final diagnoses:  Laceration of right hand with complication, initial encounter    BP 133/81 mmHg  Pulse 87  Temp(Src) 97.5 F (36.4 C) (Oral)  Resp 16  Ht 6\' 2"  (1.88 m)  Wt 248 lb 4.8 oz (112.628 kg)  BMI 31.87 kg/m2  SpO2 96%     Domenic Moras, PA-C 11/07/14 [place], PA-C 11/07/14 1853  Virgel Manifold, MD 11/09/14 [phone]

## 2014-11-08 DIAGNOSIS — S6401XA Injury of ulnar nerve at wrist and hand level of right arm, initial encounter: Secondary | ICD-10-CM | POA: Diagnosis not present

## 2014-11-10 DIAGNOSIS — S6401XA Injury of ulnar nerve at wrist and hand level of right arm, initial encounter: Secondary | ICD-10-CM | POA: Diagnosis not present

## 2014-11-10 DIAGNOSIS — S64496A Injury of digital nerve of right little finger, initial encounter: Secondary | ICD-10-CM | POA: Diagnosis not present

## 2014-11-10 DIAGNOSIS — Y999 Unspecified external cause status: Secondary | ICD-10-CM | POA: Diagnosis not present

## 2014-11-10 DIAGNOSIS — S65516A Laceration of blood vessel of right little finger, initial encounter: Secondary | ICD-10-CM | POA: Diagnosis not present

## 2014-11-10 DIAGNOSIS — X58XXXA Exposure to other specified factors, initial encounter: Secondary | ICD-10-CM | POA: Diagnosis not present

## 2014-11-10 DIAGNOSIS — Y929 Unspecified place or not applicable: Secondary | ICD-10-CM | POA: Diagnosis not present

## 2014-11-10 DIAGNOSIS — Y939 Activity, unspecified: Secondary | ICD-10-CM | POA: Diagnosis not present

## 2014-11-10 DIAGNOSIS — S61411A Laceration without foreign body of right hand, initial encounter: Secondary | ICD-10-CM | POA: Diagnosis not present

## 2014-11-22 DIAGNOSIS — N401 Enlarged prostate with lower urinary tract symptoms: Secondary | ICD-10-CM | POA: Diagnosis not present

## 2014-11-22 DIAGNOSIS — R972 Elevated prostate specific antigen [PSA]: Secondary | ICD-10-CM | POA: Diagnosis not present

## 2014-12-21 DIAGNOSIS — R972 Elevated prostate specific antigen [PSA]: Secondary | ICD-10-CM | POA: Diagnosis not present

## 2014-12-21 DIAGNOSIS — Z8551 Personal history of malignant neoplasm of bladder: Secondary | ICD-10-CM | POA: Diagnosis not present

## 2015-03-21 DIAGNOSIS — S6401XD Injury of ulnar nerve at wrist and hand level of right arm, subsequent encounter: Secondary | ICD-10-CM | POA: Diagnosis not present

## 2015-03-22 DIAGNOSIS — Z8551 Personal history of malignant neoplasm of bladder: Secondary | ICD-10-CM | POA: Diagnosis not present

## 2015-04-04 DIAGNOSIS — R109 Unspecified abdominal pain: Secondary | ICD-10-CM | POA: Diagnosis not present

## 2015-04-04 DIAGNOSIS — K5792 Diverticulitis of intestine, part unspecified, without perforation or abscess without bleeding: Secondary | ICD-10-CM | POA: Diagnosis not present

## 2015-06-28 DIAGNOSIS — R351 Nocturia: Secondary | ICD-10-CM | POA: Diagnosis not present

## 2015-06-28 DIAGNOSIS — Z8551 Personal history of malignant neoplasm of bladder: Secondary | ICD-10-CM | POA: Diagnosis not present

## 2015-07-05 ENCOUNTER — Encounter (HOSPITAL_COMMUNITY): Payer: Self-pay | Admitting: *Deleted

## 2015-07-05 ENCOUNTER — Inpatient Hospital Stay (HOSPITAL_COMMUNITY)
Admission: EM | Admit: 2015-07-05 | Discharge: 2015-07-07 | DRG: 390 | Disposition: A | Discharge status: Home / Self Care | Payer: Medicare Other | Attending: Internal Medicine | Admitting: Internal Medicine

## 2015-07-05 ENCOUNTER — Ambulatory Visit
Admission: RE | Admit: 2015-07-05 | Discharge: 2015-07-05 | Disposition: A | Discharge status: Home / Self Care | Payer: Medicare Other | Source: Ambulatory Visit | Attending: Internal Medicine | Admitting: Internal Medicine

## 2015-07-05 ENCOUNTER — Other Ambulatory Visit: Payer: Self-pay | Admitting: Internal Medicine

## 2015-07-05 ENCOUNTER — Other Ambulatory Visit: Payer: Self-pay

## 2015-07-05 DIAGNOSIS — Z7982 Long term (current) use of aspirin: Secondary | ICD-10-CM

## 2015-07-05 DIAGNOSIS — Z8551 Personal history of malignant neoplasm of bladder: Secondary | ICD-10-CM

## 2015-07-05 DIAGNOSIS — Z974 Presence of external hearing-aid: Secondary | ICD-10-CM | POA: Diagnosis not present

## 2015-07-05 DIAGNOSIS — K5660 Unspecified intestinal obstruction: Principal | ICD-10-CM | POA: Diagnosis present

## 2015-07-05 DIAGNOSIS — Z8249 Family history of ischemic heart disease and other diseases of the circulatory system: Secondary | ICD-10-CM

## 2015-07-05 DIAGNOSIS — Z9049 Acquired absence of other specified parts of digestive tract: Secondary | ICD-10-CM | POA: Diagnosis not present

## 2015-07-05 DIAGNOSIS — R110 Nausea: Secondary | ICD-10-CM

## 2015-07-05 DIAGNOSIS — Z79899 Other long term (current) drug therapy: Secondary | ICD-10-CM

## 2015-07-05 DIAGNOSIS — K5669 Other intestinal obstruction: Secondary | ICD-10-CM | POA: Diagnosis not present

## 2015-07-05 DIAGNOSIS — R109 Unspecified abdominal pain: Secondary | ICD-10-CM

## 2015-07-05 DIAGNOSIS — Z87891 Personal history of nicotine dependence: Secondary | ICD-10-CM

## 2015-07-05 DIAGNOSIS — Z800 Family history of malignant neoplasm of digestive organs: Secondary | ICD-10-CM | POA: Diagnosis not present

## 2015-07-05 DIAGNOSIS — N400 Enlarged prostate without lower urinary tract symptoms: Secondary | ICD-10-CM | POA: Diagnosis present

## 2015-07-05 DIAGNOSIS — E785 Hyperlipidemia, unspecified: Secondary | ICD-10-CM | POA: Diagnosis present

## 2015-07-05 DIAGNOSIS — Z8719 Personal history of other diseases of the digestive system: Secondary | ICD-10-CM | POA: Diagnosis not present

## 2015-07-05 DIAGNOSIS — K56609 Unspecified intestinal obstruction, unspecified as to partial versus complete obstruction: Secondary | ICD-10-CM | POA: Diagnosis present

## 2015-07-05 DIAGNOSIS — R140 Abdominal distension (gaseous): Secondary | ICD-10-CM

## 2015-07-05 LAB — CBC
HEMATOCRIT: 45.6 % (ref 39.0–52.0)
HEMOGLOBIN: 15.5 g/dL (ref 13.0–17.0)
MCH: 29.7 pg (ref 26.0–34.0)
MCHC: 34 g/dL (ref 30.0–36.0)
MCV: 87.4 fL (ref 78.0–100.0)
Platelets: 253 10*3/uL (ref 150–400)
RBC: 5.22 MIL/uL (ref 4.22–5.81)
RDW: 13.5 % (ref 11.5–15.5)
WBC: 10.5 10*3/uL (ref 4.0–10.5)

## 2015-07-05 LAB — COMPREHENSIVE METABOLIC PANEL
ALK PHOS: 55 U/L (ref 38–126)
ALT: 18 U/L (ref 17–63)
ANION GAP: 8 (ref 5–15)
AST: 19 U/L (ref 15–41)
Albumin: 3.7 g/dL (ref 3.5–5.0)
BUN: 9 mg/dL (ref 6–20)
CALCIUM: 9.3 mg/dL (ref 8.9–10.3)
CO2: 24 mmol/L (ref 22–32)
Chloride: 103 mmol/L (ref 101–111)
Creatinine, Ser: 0.76 mg/dL (ref 0.61–1.24)
GFR calc Af Amer: >60 mL/min (ref >60)
GLUCOSE: 114 mg/dL — AB (ref 65–99)
Potassium: 3.8 mmol/L (ref 3.5–5.1)
Sodium: 135 mmol/L (ref 135–145)
Total Bilirubin: 1.1 mg/dL (ref 0.3–1.2)
Total Protein: 7.3 g/dL (ref 6.5–8.1)

## 2015-07-05 LAB — URINALYSIS, ROUTINE W REFLEX MICROSCOPIC
Bilirubin Urine: NEGATIVE
Glucose, UA: NEGATIVE mg/dL
Hgb urine dipstick: NEGATIVE
Ketones, ur: NEGATIVE mg/dL
LEUKOCYTES UA: NEGATIVE
NITRITE: NEGATIVE
PH: 5 (ref 5.0–8.0)
Protein, ur: NEGATIVE mg/dL
Specific Gravity, Urine: 1.042 — ABNORMAL HIGH (ref 1.005–1.030)

## 2015-07-05 LAB — LIPASE, BLOOD: Lipase: 23 U/L (ref 11–51)

## 2015-07-05 MED ORDER — ONDANSETRON HCL 4 MG/2ML IJ SOLN: 4.0000 mg | Freq: Four times a day (QID) | INTRAMUSCULAR | Status: DC | PRN

## 2015-07-05 MED ORDER — DEXTROSE-NACL 5-0.9 % IV SOLN
INTRAVENOUS | Status: DC
  Administered 2015-07-05 – 2015-07-07 (×3): via INTRAVENOUS

## 2015-07-05 MED ORDER — IOPAMIDOL (ISOVUE-300) INJECTION 61%
100.0000 mL | Freq: Once | INTRAVENOUS | Status: AC | PRN
  Administered 2015-07-05: 100 mL via INTRAVENOUS

## 2015-07-05 MED ORDER — ENOXAPARIN SODIUM 40 MG/0.4ML ~~LOC~~ SOLN
40.0000 mg | SUBCUTANEOUS | Status: DC
  Administered 2015-07-05 – 2015-07-06 (×2): 40 mg via SUBCUTANEOUS
  Filled 2015-07-05 (×2): qty 0.4

## 2015-07-05 MED ORDER — SODIUM CHLORIDE 0.9 % IV BOLUS (SEPSIS)
1000.0000 mL | Freq: Once | INTRAVENOUS | Status: AC
  Administered 2015-07-05: 1000 mL via INTRAVENOUS

## 2015-07-05 MED ORDER — MORPHINE SULFATE (PF) 2 MG/ML IV SOLN: 2.0000 mg | INTRAVENOUS | Status: DC | PRN

## 2015-07-05 MED ORDER — ACETAMINOPHEN 325 MG PO TABS: 650.0000 mg | ORAL_TABLET | Freq: Four times a day (QID) | ORAL | Status: DC | PRN

## 2015-07-05 MED ORDER — ACETAMINOPHEN 650 MG RE SUPP: 650.0000 mg | Freq: Four times a day (QID) | RECTAL | Status: DC | PRN

## 2015-07-05 MED ORDER — ONDANSETRON HCL 4 MG PO TABS: 4.0000 mg | ORAL_TABLET | Freq: Four times a day (QID) | ORAL | Status: DC | PRN

## 2015-07-05 MED ORDER — SODIUM CHLORIDE 0.45 % IV SOLN: INTRAVENOUS | Status: DC

## 2015-07-05 NOTE — ED Provider Notes (Addendum)
CSN: [id number]     Arrival date & time 07/05/15  1656 History   First MD Initiated Contact with Patient 07/05/15 1943     Chief Complaint  Patient presents with  . Abdominal Pain     (Consider location/radiation/quality/duration/timing/severity/associated sxs/prior Treatment) Patient is a 70 y.o. male presenting with abdominal pain. The history is provided by the patient.  Abdominal Pain Pain location:  RLQ Pain quality: sharp and shooting   Pain radiates to:  Does not radiate Pain severity:  Moderate Onset quality:  Sudden Duration:  16 hours Timing:  Constant Progression:  Worsening Chronicity:  New Context: awakening from sleep   Relieved by:  Nothing Worsened by:  Movement and palpation (walking) Ineffective treatments:  None tried Associated symptoms: constipation, nausea and vomiting   Associated symptoms: no chest pain, no chills, no diarrhea, no fever and no shortness of breath    70 yo M With a chief complaint of abdominal pain. This woke him up from sleep sharp and severe. Was initially diffuse and then migrated to the right lower quadrant. Patient has a history of multiple surgeries in the past. He had a comp located diverticulitis that caused him to have a partial colectomy. He also had a small bowel obstruction due to adhesions from that needed surgical intervention. Since the patient also has had a prostatectomy and had a macerated from his bladder. The patient has had no flatus today.   Past Medical History  Diagnosis Date  . ED (erectile dysfunction)   . Hx SBO   . History of diverticulitis   . Focal dystonia NEUROLOGIST-  DR Star Age    RIGHT HAND AND ARM  . Hyperlipidemia   . History of kidney stones   . History of bladder cancer urologist--  dr Karsten Ro    05/ 2014  single papillary (Ta G1)  . Bladder tumor   . Diverticulosis of colon   . BPH (benign prostatic hypertrophy)   . Wears hearing aid     BILATERAL  . Generalized OA     FINGERS   Past  Surgical History  Procedure Laterality Date  . Partial colectomy  1998    diverticulitis  . Exploratory laparotomy w/ bowel resection  11/ 2007    SBO  . Transurethral resection of prostate  5/ 2014    and Resection Bladder Tumor  . Green light laser turp (transurethral resection of prostate  2004  . Left ureteroscopic stone extraction  3/ 2013  . Transurethral resection of bladder tumor N/A 11/20/2013    Procedure: TRANSURETHRAL RESECTION OF BLADDER TUMOR (TURBT);  Surgeon: Claybon Jabs, MD;  Location: [facility];  Service: Urology;  Laterality: N/A;   Family History  Problem Relation Age of Onset  . Colon cancer      Family hx  . Dementia Father   . Cancer Mother   . Hypertension Mother   . Heart failure Brother   . Coronary artery disease Brother    Social History  Substance Use Topics  . Smoking status: Former Smoker -- 1.00 packs/day for 30 years    Types: Cigarettes    Quit date: 11/19/1993  . Smokeless tobacco: Never Used  . Alcohol Use: 4.2 oz/week    3 Standard drinks or equivalent, 4 Glasses of wine per week    Review of Systems  Constitutional: Negative for fever and chills.  HENT: Negative for congestion and facial swelling.   Eyes: Negative for discharge and visual disturbance.  Respiratory: Negative for shortness of breath.   Cardiovascular: Negative for chest pain and palpitations.  Gastrointestinal: Positive for nausea, vomiting, abdominal pain and constipation. Negative for diarrhea.  Musculoskeletal: Negative for myalgias and arthralgias.  Skin: Negative for color change and rash.  Neurological: Negative for tremors, syncope and headaches.  Psychiatric/Behavioral: Negative for confusion and dysphoric mood.      Allergies  Review of patient's allergies indicates no known allergies.  Home Medications   Prior to Admission medications   Medication Sig Start Date End Date Taking? Authorizing Provider  aspirin 81 MG tablet Take 81 mg  by mouth daily.   Yes Historical Provider, MD  atorvastatin (LIPITOR) 20 MG tablet Take 1 tablet by mouth every evening.  12/31/12  Yes Historical Provider, MD  FIBER PO Take 2 capsules by mouth daily.   Yes Historical Provider, MD  glucosamine-chondroitin 500-400 MG tablet Take 1 tablet by mouth daily.    Yes Historical Provider, MD  KRILL OIL PO Take 1 tablet by mouth daily.   Yes Historical Provider, MD  Multiple Vitamin (MULTIVITAMIN) tablet Take 1 tablet by mouth daily.   Yes Historical Provider, MD   BP 125/86 mmHg  Pulse 88  Temp(Src) 98.2 F (36.8 C) (Oral)  Resp 18  SpO2 97% Physical Exam  Constitutional: He is oriented to person, place, and time. He appears well-developed and well-nourished.  HENT:  Head: Normocephalic and atraumatic.  Eyes: EOM are normal. Pupils are equal, round, and reactive to light.  Neck: Normal range of motion. Neck supple. No JVD present.  Cardiovascular: Normal rate and regular rhythm.  Exam reveals no gallop and no friction rub.   No murmur heard. Pulmonary/Chest: No respiratory distress. He has no wheezes.  Abdominal: He exhibits no distension. There is tenderness (worst to the RLQ). There is no rebound and no guarding.  Musculoskeletal: Normal range of motion.  Neurological: He is alert and oriented to person, place, and time.  Skin: No rash noted. No pallor.  Psychiatric: He has a normal mood and affect. His behavior is normal.  Nursing note and vitals reviewed.   ED Course  Procedures (including critical care time) Labs Review Labs Reviewed  COMPREHENSIVE METABOLIC PANEL - Abnormal; Notable for the following:    Glucose, Bld 114 (*)    All other components within normal limits  LIPASE, BLOOD  CBC  URINALYSIS, ROUTINE W REFLEX MICROSCOPIC (NOT AT [facility])    Imaging Review Ct Abdomen Pelvis W Contrast  07/05/2015  CLINICAL DATA:  Generalized abdominal pain, nausea, abdominal distension, history of diverticulitis, bladder cancer, prior  colon resection, former smoker EXAM: CT ABDOMEN AND PELVIS WITH CONTRAST TECHNIQUE: Multidetector CT imaging of the abdomen and pelvis was performed using the standard protocol following bolus administration of intravenous contrast. Sagittal and coronal MPR images reconstructed from axial data set. Creatinine was obtained on site at [facility] at [address] Results: Creatinine 0.9 mg/dL. CONTRAST:  158mL ISOVUE-300 IOPAMIDOL (ISOVUE-300) INJECTION 61% IV. No oral contrast administered. COMPARISON:  None FINDINGS: Lower chest:  Minimal dependent atelectasis at lung bases. Hepatobiliary: Liver and gallbladder normal appearance. Pancreas: Normal appearance Spleen: Normal appearance Adrenals/Urinary Tract: Adrenal glands normal appearance. Small LEFT renal cyst. 12 mm nonobstructing calculus inferior pole LEFT kidney. Kidneys otherwise normal appearance. No hydronephrosis, ureteral calcification or ureteral dilatation. Decompressed bladder. Prostatic enlargement, gland 6.0 x 4.2 cm image 94. Stomach/Bowel: Appendix not definitely identified. Diffuse colonic diverticulosis. Question segmental bowel wall thickening of the sigmoid colon, unable to exclude  sigmoid mass though this could be an artifact from underdistention. Distended stomach and proximal small bowel loops. Bowel wall thickening in the mid small bowel in the RIGHT mid abdomen with associated edema of the small bowel mesenteries and small bowel stool sign compatible with small bowel obstruction. Dilatation terminates near the anterior abdominal wall question due to adhesion. Distal small bowel decompressed. Vascular/Lymphatic: Scattered atherosclerotic calcifications aorta and iliac arteries. Coronary arterial calcifications noted. No enlarged abdominal or pelvic lymph nodes. Reproductive: N/A Other: BILATERAL inguinal hernias containing fat. Small amount of free fluid adjacent to the dilated thickened small bowel loops in the RIGHT mid  abdomen. No free intraperitoneal air. Musculoskeletal: Unremarkable IMPRESSION: Mid small bowel obstruction with transition zone in anterior RIGHT mid abdomen, question from adhesion. Thickening of the distal small bowel is identified immediately prior to the point of obstruction which could reflect a focal enteritis due to infection or inflammatory bowel disease though cannot completely exclude ischemia secondary to obstruction. No definite evidence of perforation. Colonic diverticulosis diffusely with questionable thickened segment of sigmoid colon versus artifact ; recommend correlation with colonoscopy to exclude colonic neoplasm. Aortic atherosclerosis. Coronary atherosclerosis. Prostatic enlargement. Nonobstructing 12 mm LEFT renal calculus. BILATERAL inguinal hernias containing fat. Findings called to Dr. Shelia Media On 07/05/2015 at 1435 hr. Electronically Signed   By: Lavonia Dana M.D.   On: 07/05/2015 16:39   I have personally reviewed and evaluated these images and lab results as part of my medical decision-making.  ED ECG REPORT   Date: 07/05/2015  Rate: 80  Rhythm: normal sinus rhythm  QRS Axis: normal  Intervals: normal  ST/T Wave abnormalities: normal  Conduction Disutrbances:none  Narrative Interpretation:   Old EKG Reviewed: none available  I have personally reviewed the EKG tracing and agree with the computerized printout as noted.   MDM   Final diagnoses:  SBO (small bowel obstruction) ([facility])    70 yo M with a chief complaint of abdominal pain. He went to his family physician to cut a CT scan that was consistent with a small bowel obstruction. He was then sent to the ED.  The patients results and plan were reviewed and discussed.   I discussed the case with Dr. Ninfa Linden, general surgery. They will evaluate the patient in the hospital.  Any x-rays performed were independently reviewed by myself.    Differential diagnosis were considered with the presenting  HPI.  Medications  0.45 % sodium chloride infusion (not administered)  sodium chloride 0.9 % bolus 1,000 mL (1,000 mLs Intravenous New Bag/Given 07/05/15 2058)    Filed Vitals:   07/05/15 1711  BP: 125/86  Pulse: 88  Temp: 98.2 F (36.8 C)  TempSrc: Oral  Resp: 18  SpO2: 97%    Final diagnoses:  SBO (small bowel obstruction) ([facility])    Admission/ observation were discussed with the admitting physician, patient and/or family and they are comfortable with the plan.      Deno Etienne, DO 07/05/15 2033  Deno Etienne, DO 07/05/15 2248

## 2015-07-05 NOTE — Consult Note (Signed)
Reason for Consult:SBO Referring Physician: Dr. Lily Kocher  Douglas Holmes is an 70 y.o. male.  HPI: This is a pleasant gentleman who [place] He has had multiple abdominal procedures and a history of small bowel obstruction the past requiring a laparotomy with lysis of adhesions in 2007. He is also had a prior partial colectomy for diverticulitis. He started having abdominal pain with distention yesterday and began having nausea and vomiting. An outpatient CAT scan of the abdomen and pelvis was ordered showing findings consistent with a partial small bowel obstruction with a transition zone in the right lower abdomen. His last bowel movement was this morning. He is not certain when he passed flatus yet. He reports currently no further nausea unless he tries to get up. His abdominal pain is minimal. A nasogastric tube is been held. He again reports now having minimal abdominal discomfort.  Past Medical History  Diagnosis Date  . ED (erectile dysfunction)   . Hx SBO   . History of diverticulitis   . Focal dystonia NEUROLOGIST-  DR Star Age    RIGHT HAND AND ARM  . Hyperlipidemia   . History of kidney stones   . History of bladder cancer urologist--  dr Karsten Ro    05/ 2014  single papillary (Ta G1)  . Bladder tumor   . Diverticulosis of colon   . BPH (benign prostatic hypertrophy)   . Wears hearing aid     BILATERAL  . Generalized OA     FINGERS    Past Surgical History  Procedure Laterality Date  . Partial colectomy  1998    diverticulitis  . Exploratory laparotomy w/ bowel resection  11/ 2007    SBO  . Transurethral resection of prostate  5/ 2014    and Resection Bladder Tumor  . Green light laser turp (transurethral resection of prostate  2004  . Left ureteroscopic stone extraction  3/ 2013  . Transurethral resection of bladder tumor N/A 11/20/2013    Procedure: TRANSURETHRAL RESECTION OF BLADDER TUMOR (TURBT);  Surgeon: Claybon Jabs, MD;   Location: [facility];  Service: Urology;  Laterality: N/A;    Family History  Problem Relation Age of Onset  . Colon cancer      Family hx  . Dementia Father   . Cancer Mother   . Hypertension Mother   . Heart failure Brother   . Coronary artery disease Brother     Social History:  reports that he quit smoking about 21 years ago. His smoking use included Cigarettes. He has a 30 pack-year smoking history. He has never used smokeless tobacco. He reports that he drinks about 4.2 oz of alcohol per week. He reports that he does not use illicit drugs.  Allergies: No Known Allergies  Medications: I have reviewed the patient's current medications.  Results for orders placed or performed during the hospital encounter of 07/05/15 (from the past 48 hour(s))  Lipase, blood     Status: None   Collection Time: 07/05/15  5:34 PM  Result Value Ref Range   Lipase 23 11 - 51 U/L  Comprehensive metabolic panel     Status: Abnormal   Collection Time: 07/05/15  5:34 PM  Result Value Ref Range   Sodium 135 135 - 145 mmol/L   Potassium 3.8 3.5 - 5.1 mmol/L   Chloride 103 101 - 111 mmol/L   CO2 24 22 - 32 mmol/L   Glucose, Bld 114 (  H) 65 - 99 mg/dL   BUN 9 6 - 20 mg/dL   Creatinine, Ser 0.76 0.61 - 1.24 mg/dL   Calcium 9.3 8.9 - 10.3 mg/dL   Total Protein 7.3 6.5 - 8.1 g/dL   Albumin 3.7 3.5 - 5.0 g/dL   AST 19 15 - 41 U/L   ALT 18 17 - 63 U/L   Alkaline Phosphatase 55 38 - 126 U/L   Total Bilirubin 1.1 0.3 - 1.2 mg/dL   GFR calc non Af Amer >60 >60 mL/min   GFR calc Af Amer >60 >60 mL/min    Comment: (NOTE) The eGFR has been calculated using the CKD EPI equation. This calculation has not been validated in all clinical situations. eGFR's persistently <60 mL/min signify possible Chronic Kidney Disease.    Anion gap 8 5 - 15  CBC     Status: None   Collection Time: 07/05/15  5:34 PM  Result Value Ref Range   WBC 10.5 4.0 - 10.5 K/uL   RBC 5.22 4.22 - 5.81 MIL/uL    Hemoglobin 15.5 13.0 - 17.0 g/dL   HCT 45.6 39.0 - 52.0 %   MCV 87.4 78.0 - 100.0 fL   MCH 29.7 26.0 - 34.0 pg   MCHC 34.0 30.0 - 36.0 g/dL   RDW 13.5 11.5 - 15.5 %   Platelets 253 150 - 400 K/uL    Ct Abdomen Pelvis W Contrast  07/05/2015  CLINICAL DATA:  Generalized abdominal pain, nausea, abdominal distension, history of diverticulitis, bladder cancer, prior colon resection, former smoker EXAM: CT ABDOMEN AND PELVIS WITH CONTRAST TECHNIQUE: Multidetector CT imaging of the abdomen and pelvis was performed using the standard protocol following bolus administration of intravenous contrast. Sagittal and coronal MPR images reconstructed from axial data set. Creatinine was obtained on site at [place] at [address] Results: Creatinine 0.9 mg/dL. CONTRAST:  122m ISOVUE-300 IOPAMIDOL (ISOVUE-300) INJECTION 61% IV. No oral contrast administered. COMPARISON:  None FINDINGS: Lower chest:  Minimal dependent atelectasis at lung bases. Hepatobiliary: Liver and gallbladder normal appearance. Pancreas: Normal appearance Spleen: Normal appearance Adrenals/Urinary Tract: Adrenal glands normal appearance. Small LEFT renal cyst. 12 mm nonobstructing calculus inferior pole LEFT kidney. Kidneys otherwise normal appearance. No hydronephrosis, ureteral calcification or ureteral dilatation. Decompressed bladder. Prostatic enlargement, gland 6.0 x 4.2 cm image 94. Stomach/Bowel: Appendix not definitely identified. Diffuse colonic diverticulosis. Question segmental bowel wall thickening of the sigmoid colon, unable to exclude sigmoid mass though this could be an artifact from underdistention. Distended stomach and proximal small bowel loops. Bowel wall thickening in the mid small bowel in the RIGHT mid abdomen with associated edema of the small bowel mesenteries and small bowel stool sign compatible with small bowel obstruction. Dilatation terminates near the anterior abdominal wall question due to adhesion.  Distal small bowel decompressed. Vascular/Lymphatic: Scattered atherosclerotic calcifications aorta and iliac arteries. Coronary arterial calcifications noted. No enlarged abdominal or pelvic lymph nodes. Reproductive: N/A Other: BILATERAL inguinal hernias containing fat. Small amount of free fluid adjacent to the dilated thickened small bowel loops in the RIGHT mid abdomen. No free intraperitoneal air. Musculoskeletal: Unremarkable IMPRESSION: Mid small bowel obstruction with transition zone in anterior RIGHT mid abdomen, question from adhesion. Thickening of the distal small bowel is identified immediately prior to the point of obstruction which could reflect a focal enteritis due to infection or inflammatory bowel disease though cannot completely exclude ischemia secondary to obstruction. No definite evidence of perforation. Colonic diverticulosis diffusely with questionable thickened segment of  sigmoid colon versus artifact ; recommend correlation with colonoscopy to exclude colonic neoplasm. Aortic atherosclerosis. Coronary atherosclerosis. Prostatic enlargement. Nonobstructing 12 mm LEFT renal calculus. BILATERAL inguinal hernias containing fat. Findings called to Dr. Shelia Media On 07/05/2015 at 1435 hr. Electronically Signed   By: Lavonia Dana M.D.   On: 07/05/2015 16:39    Review of Systems  All other systems reviewed and are negative.  Blood pressure 125/86, pulse 88, temperature 98.2 F (36.8 C), temperature source Oral, resp. rate 18, SpO2 97 %. Physical Exam  Constitutional: He is oriented to person, place, and time. He appears well-developed and well-nourished. No distress.  HENT:  Head: Normocephalic and atraumatic.  Right Ear: External ear normal.  Left Ear: External ear normal.  Nose: Nose normal.  Mouth/Throat: Oropharynx is clear and moist. No oropharyngeal exudate.  Eyes: Pupils are equal, round, and reactive to light. Right eye exhibits no discharge. Left eye exhibits no discharge. No  scleral icterus.  Neck: Normal range of motion. No tracheal deviation present.  Cardiovascular: Normal rate, regular rhythm, normal heart sounds and intact distal pulses.   No murmur heard. Respiratory: Effort normal and breath sounds normal. No respiratory distress. He has no wheezes.  GI: Soft. There is tenderness.  There is very mild abdominal distention with some mild tenderness and guarding in the right lower abdomen. There is a well-healed midline incision  Musculoskeletal: Normal range of motion. He exhibits no edema or tenderness.  Neurological: He is alert and oriented to person, place, and time.  Skin: Skin is warm and dry. No rash noted. He is not diaphoretic. No erythema.  Psychiatric: His behavior is normal. Judgment normal.    Assessment/Plan: Patient with a small bowel obstruction  He is being admitted to the hospital. Currently he is undergoing bowel rest and IV rehydration, and nasogastric tube will be held and less he developed emesis again. Abdominal x-rays will be repeated in the morning. Pending those results, we may institute the small bowel protocol. We will follow him closely.  Leiann Sporer A 07/05/2015, 9:17 PM

## 2015-07-05 NOTE — H&P (Signed)
History and Physical    Douglas Holmes [id number] DOB: 10-Jan-1946 DOA: 07/05/2015  PCP: Horatio Pel, MD   UROLOGY: Dr. Consuella Lose  Patient coming from: Home  Chief Complaint: Abdominal pain, nausea, and vomiting  HPI: Douglas Holmes is a 70 y.o. gentleman with a history of bladder cancer, BPH, partial colectomy for a history of diverticulitis, and prior SBO requiring LOA who feels that he was in his baseline state of health until Thursday.  He started feeling "out of sorts" then, but he did not have specific symptoms until Monday, when he noted decreased appetite and abdominal distention.  By this morning, he had abdominal pain associated with nausea and vomiting x 2.  His last bowel movement was actually this morning.  He does not know the last time that he passed gas.  No fever.  No hematemesis, coffee ground emesis (that he recalls), BRBPR, or melena.  No syncope.  No falls.  No head trauma. No lower urinary tract symptoms.  He presented to an outside provider, who referred for outpatient CT of the abdomen and pelvis with contrast.  Findings include mid small bowel obstruction with transition zone in the right abdomen.  There is also questionable thickening of the sigmoid colon wall; correlation with endoscopy findings recommended.  ED Course: IV fluids ordered.  Basic labs ordered.  Hospitalist asked to admit.  General Surgery consult pending.  Review of Systems: No unexplained chest pain, shortness of breath, or edema.  Last colonoscopy was in [place], within the past five years.  Last cystoscopy in November 2015.  Otherwise, 10 systems reviewed and negative except as stated in the HPI.      Past Medical History  Diagnosis Date  . ED (erectile dysfunction)   . Hx SBO   . History of diverticulitis   . Focal dystonia NEUROLOGIST-  DR Star Age    RIGHT HAND AND ARM  . Hyperlipidemia   . History of kidney stones   . History of bladder cancer urologist--  dr Karsten Ro   05/ 2014  single papillary (Ta G1)  . Bladder tumor   . Diverticulosis of colon   . BPH (benign prostatic hypertrophy)   . Wears hearing aid     BILATERAL  . Generalized OA     FINGERS    Past Surgical History  Procedure Laterality Date  . Partial colectomy  1998    diverticulitis  . Exploratory laparotomy w/ bowel resection  11/ 2007    SBO  . Transurethral resection of prostate  5/ 2014    and Resection Bladder Tumor  . Green light laser turp (transurethral resection of prostate  2004  . Left ureteroscopic stone extraction  3/ 2013  . Transurethral resection of bladder tumor N/A 11/20/2013    Procedure: TRANSURETHRAL RESECTION OF BLADDER TUMOR (TURBT);  Surgeon: Claybon Jabs, MD;  Location: [facility];  Service: Urology;  Laterality: N/A;     reports that he quit smoking about 21 years ago. His smoking use included Cigarettes. He has a 30 pack-year smoking history. He has never used smokeless tobacco. He reports that he drinks about 4.2 oz of alcohol per week. He reports that he does not use illicit drugs. He has an alcoholic beverage several time per week.  No history of EtOH withdrawal symptoms.  Moved to [place] from [place] in July 2014.  He is married.  He has three children.  He is retired.  No Known Allergies  Family History  Problem Relation Age of Onset  . Colon cancer      Family hx  . Dementia Father   . Cancer Mother   . Hypertension Mother   . Heart failure Brother   . Coronary artery disease Brother      Prior to Admission medications   Medication Sig Start Date End Date Taking? Authorizing Provider  aspirin 81 MG tablet Take 81 mg by mouth daily.    Historical Provider, MD  atorvastatin (LIPITOR) 20 MG tablet Take 1 tablet by mouth every evening.  12/31/12   Historical Provider, MD  cephALEXin (KEFLEX) 500 MG capsule Take 1 capsule (500 mg total) by mouth 4 (four) times daily. 11/07/14   Domenic Moras, PA-C  glucosamine-chondroitin  500-400 MG tablet Take 1 tablet by mouth 3 (three) times daily.    Historical Provider, MD  HYDROcodone-acetaminophen (NORCO) 7.5-325 MG tablet Take 1 tablet by mouth every 6 (six) hours as needed for moderate pain. Maximum dose per 24 hours - 8 pills 11/07/14   Domenic Moras, PA-C  KRILL OIL PO Take 1 tablet by mouth daily.    Historical Provider, MD  Multiple Vitamin (MULTIVITAMIN) tablet Take 1 tablet by mouth daily.    Historical Provider, MD  phenazopyridine (PYRIDIUM) 200 MG tablet Take 1 tablet (200 mg total) by mouth 3 (three) times daily as needed for pain. 11/20/13   Kathie Rhodes, MD    Physical Exam: Filed Vitals:   07/05/15 1711  BP: 125/86  Pulse: 88  Temp: 98.2 F (36.8 C)  TempSrc: Oral  Resp: 18  SpO2: 97%      Constitutional: NAD, calm, comfortable, NONtoxic appearing Filed Vitals:   07/05/15 1711  BP: 125/86  Pulse: 88  Temp: 98.2 F (36.8 C)  TempSrc: Oral  Resp: 18  SpO2: 97%   Eyes: PERRL, lids and conjunctivae normal ENMT: Mucous membranes are moist. Posterior pharynx clear of any exudate or lesions. Normal dentition.  Neck: normal appearance, supple Respiratory: clear to auscultation bilaterally, no wheezing, no crackles. Normal respiratory effort. No accessory muscle use.  Cardiovascular: Normal rate, regular rhythm, no murmurs / rubs / gallops. No extremity edema. 2+ pedal pulses.  GI: abdomen is soft and compressible.  Mild distention.  There is tenderness to the right upper and lower quadrants but no significant guarding.  Bowel sounds are hypoactive.   Musculoskeletal:  No joint deformity in upper and lower extremities. Good ROM, no contractures. Normal muscle tone.  Skin: no rashes, warm and dry Neurologic: CN 2-12 grossly intact. Sensation intact, Strength symmetric bilaterally, 5/5  Psychiatric: Normal judgment and insight. Alert and oriented x 3. Normal mood.     Labs on Admission: I have personally reviewed following labs and imaging  studies  CBC:  Recent Labs Lab 07/05/15 1734  WBC 10.5  HGB 15.5  HCT 45.6  MCV 87.4  PLT 123456   Basic Metabolic Panel:  Recent Labs Lab 07/05/15 1734  NA 135  K 3.8  CL 103  CO2 24  GLUCOSE 114*  BUN 9  CREATININE 0.76  CALCIUM 9.3   GFR: CrCl cannot be calculated (Unknown ideal weight.). Liver Function Tests:  Recent Labs Lab 07/05/15 1734  AST 19  ALT 18  ALKPHOS 55  BILITOT 1.1  PROT 7.3  ALBUMIN 3.7    Recent Labs Lab 07/05/15 1734  LIPASE 23    Radiological Exams on Admission: Ct Abdomen Pelvis W Contrast  07/05/2015  CLINICAL DATA:  Generalized abdominal pain, nausea, abdominal distension, history  of diverticulitis, bladder cancer, prior colon resection, former smoker EXAM: CT ABDOMEN AND PELVIS WITH CONTRAST TECHNIQUE: Multidetector CT imaging of the abdomen and pelvis was performed using the standard protocol following bolus administration of intravenous contrast. Sagittal and coronal MPR images reconstructed from axial data set. Creatinine was obtained on site at [place] at [address] Results: Creatinine 0.9 mg/dL. CONTRAST:  12mL ISOVUE-300 IOPAMIDOL (ISOVUE-300) INJECTION 61% IV. No oral contrast administered. COMPARISON:  None FINDINGS: Lower chest:  Minimal dependent atelectasis at lung bases. Hepatobiliary: Liver and gallbladder normal appearance. Pancreas: Normal appearance Spleen: Normal appearance Adrenals/Urinary Tract: Adrenal glands normal appearance. Small LEFT renal cyst. 12 mm nonobstructing calculus inferior pole LEFT kidney. Kidneys otherwise normal appearance. No hydronephrosis, ureteral calcification or ureteral dilatation. Decompressed bladder. Prostatic enlargement, gland 6.0 x 4.2 cm image 94. Stomach/Bowel: Appendix not definitely identified. Diffuse colonic diverticulosis. Question segmental bowel wall thickening of the sigmoid colon, unable to exclude sigmoid mass though this could be an artifact from  underdistention. Distended stomach and proximal small bowel loops. Bowel wall thickening in the mid small bowel in the RIGHT mid abdomen with associated edema of the small bowel mesenteries and small bowel stool sign compatible with small bowel obstruction. Dilatation terminates near the anterior abdominal wall question due to adhesion. Distal small bowel decompressed. Vascular/Lymphatic: Scattered atherosclerotic calcifications aorta and iliac arteries. Coronary arterial calcifications noted. No enlarged abdominal or pelvic lymph nodes. Reproductive: N/A Other: BILATERAL inguinal hernias containing fat. Small amount of free fluid adjacent to the dilated thickened small bowel loops in the RIGHT mid abdomen. No free intraperitoneal air. Musculoskeletal: Unremarkable IMPRESSION: Mid small bowel obstruction with transition zone in anterior RIGHT mid abdomen, question from adhesion. Thickening of the distal small bowel is identified immediately prior to the point of obstruction which could reflect a focal enteritis due to infection or inflammatory bowel disease though cannot completely exclude ischemia secondary to obstruction. No definite evidence of perforation. Colonic diverticulosis diffusely with questionable thickened segment of sigmoid colon versus artifact ; recommend correlation with colonoscopy to exclude colonic neoplasm. Aortic atherosclerosis. Coronary atherosclerosis. Prostatic enlargement. Nonobstructing 12 mm LEFT renal calculus. BILATERAL inguinal hernias containing fat. Findings called to Dr. Shelia Media On 07/05/2015 at 1435 hr. Electronically Signed   By: Lavonia Dana M.D.   On: 07/05/2015 16:39    EKG: Pending.  Assessment/Plan Principal Problem:   SBO (small bowel obstruction) (HCC) Active Problems:   History of bladder cancer   Enlarged prostate   History of diverticulosis      SBO --Admit to med surg --Bowel rest and NPO status now --General surgery consult pending --Anti-emetics and  analgesics as needed; defer NG tube for now since the patient does not have intractable nausea and vomiting. --Maintenance fluids while NPO --General surgery consult pending --May need GI consult as well for abnormal findings at sigmoid colon (inpatient vs outpatient depending on clinical course)  HLD --Hold statin while NPO   DVT prophylaxis: Lovenox Code Status: FULL Family Communication: Wife present at bedside at time of admission from the ED Disposition Plan: To be determined Consults called: General Surgery called by the ED attending Admission status: Inpatient, med surg   TIME SPENT:  69 minutes   Eber Jones MD [facility] Pager [phone]  If 7PM-7AM, please contact night-coverage [email or web address] Password [facility]  07/05/2015, 8:36 PM

## 2015-07-05 NOTE — ED Notes (Signed)
Pt went to see MD today with abdominal pain since this am and it has been intermittent.  Pt states not feeling well since Thursday.  Had CT scan today at [facility] which showed he had an intestinal obstruction, not passing gas. No vomiting.

## 2015-07-06 ENCOUNTER — Inpatient Hospital Stay (HOSPITAL_COMMUNITY): Payer: Medicare Other

## 2015-07-06 DIAGNOSIS — K5669 Other intestinal obstruction: Secondary | ICD-10-CM

## 2015-07-06 LAB — APTT: APTT: 32 s (ref 24–37)

## 2015-07-06 LAB — PROTIME-INR
INR: 1.1 (ref 0.00–1.49)
Prothrombin Time: 14.4 seconds (ref 11.6–15.2)

## 2015-07-06 NOTE — Progress Notes (Signed)
Pt a/o, no c/o pain, no c/o N/V, VSS, pt stable, pt passing gas

## 2015-07-06 NOTE — Progress Notes (Addendum)
Patient ID: Douglas Holmes, male   DOB: Jan 05, 1946, 70 y.o.   MRN: [id number]   PROGRESS NOTE    Douglas Holmes  [id number] DOB: July 06, 1945 DOA: 07/05/2015  PCP: Horatio Pel, MD   Brief Narrative:  70 y.o. gentleman with bladder cancer, BPH, partial colectomy for a history of diverticulitis, and prior SBO requiring LOA who feels that he was in his baseline state of health until Thursday. He started feeling "out of sorts" then, but he did not have specific symptoms until Monday, when he noted decreased appetite and abdominal distention.   Assessment & Plan:   Principal Problem:   SBO (small bowel obstruction) ([place]) - continue with bowel rest - surgery team following - provide antiemetics and analgesia as needed  - possibly advance to clears today if surgery ok with plan   Active Problems:   History of bladder cancer   Enlarged prostate - monitor urine output    DVT prophylaxis: Lovenox Sq Code Status: Full  Family Communication: Patient at bedside  Disposition Plan: Home when cleared by surgery   Consultants:   Surgery   Procedures:   None  Antimicrobials:   None   Subjective: Reports feeling better this AM.   Objective: Filed Vitals:   07/05/15 1711 07/05/15 2216 07/06/15 0505  BP: 125/86 111/72 108/65  Pulse: 88 80 93  Temp: 98.2 F (36.8 C) 98.6 F (37 C) 98.6 F (37 C)  TempSrc: Oral Oral Oral  Resp: 18 17   Height:  6\' 2"  (1.88 m)   Weight:  108.5 kg (239 lb 3.2 oz)   SpO2: 97% 95% 93%    Intake/Output Summary (Last 24 hours) at 07/06/15 0644 Last data filed at 07/06/15 0505  Gross per 24 hour  Intake      0 ml  Output    850 ml  Net   -850 ml   Filed Weights   07/05/15 2216  Weight: 108.5 kg (239 lb 3.2 oz)    Examination:  General exam: Appears calm and comfortable  Respiratory system: Clear to auscultation. Respiratory effort normal. Cardiovascular system: S1 & S2 heard, RRR. No JVD, murmurs, rubs, gallops or clicks.  No pedal edema. Gastrointestinal system: Abdomen is nondistended, soft and nontender. No organomegaly or masses felt. Normal bowel sounds heard. Central nervous system: Alert and oriented. No focal neurological deficits. Extremities: Symmetric 5 x 5 power. Skin: No rashes, lesions or ulcers Psychiatry: Judgement and insight appear normal. Mood & affect appropriate.    Data Reviewed: I have personally reviewed following labs and imaging studies  CBC:  Recent Labs Lab 07/05/15 1734  WBC 10.5  HGB 15.5  HCT 45.6  MCV 87.4  PLT 123456   Basic Metabolic Panel:  Recent Labs Lab 07/05/15 1734  NA 135  K 3.8  CL 103  CO2 24  GLUCOSE 114*  BUN 9  CREATININE 0.76  CALCIUM 9.3   Liver Function Tests:  Recent Labs Lab 07/05/15 1734  AST 19  ALT 18  ALKPHOS 55  BILITOT 1.1  PROT 7.3  ALBUMIN 3.7    Recent Labs Lab 07/05/15 1734  LIPASE 23   Coagulation Profile:  Recent Labs Lab 07/06/15 0330  INR 1.10   Urine analysis:    Component Value Date/Time   COLORURINE YELLOW 07/05/2015 [place] 07/05/2015 2253   LABSPEC 1.042* 07/05/2015 2253   PHURINE 5.0 07/05/2015 2253   GLUCOSEU NEGATIVE 07/05/2015 2253   HGBUR NEGATIVE 07/05/2015 2253   BILIRUBINUR NEGATIVE 07/05/2015  [place] 07/05/2015 2253   [place] 07/05/2015 2253   NITRITE NEGATIVE 07/05/2015 2253   LEUKOCYTESUR NEGATIVE 07/05/2015 2253    Radiology Studies: Ct Abdomen Pelvis W Contrast 07/05/2015 Mid small bowel obstruction with transition zone in anterior RIGHT mid abdomen, question from adhesion. Thickening of the distal small bowel is identified immediately prior to the point of obstruction which could reflect a focal enteritis due to infection or inflammatory bowel disease though cannot completely exclude ischemia secondary to obstruction. No definite evidence of perforation. Colonic diverticulosis diffusely with questionable thickened segment of sigmoid  colon versus artifact ; recommend correlation with colonoscopy to exclude colonic neoplasm. Aortic atherosclerosis. Coronary atherosclerosis. Prostatic enlargement. Nonobstructing 12 mm LEFT renal calculus. BILATERAL inguinal hernias containing fat.   Scheduled Meds: . enoxaparin (LOVENOX) injection  40 mg Subcutaneous Q24H   Continuous Infusions: . dextrose 5 % and 0.9% NaCl 75 mL/hr at 07/05/15 2155    LOS: 1 day   Time spent: 20 minutes   Faye Ramsay, MD [facility] Pager [phone]  If 7PM-7AM, please contact night-coverage [email or web address] Password [facility] 07/06/2015, 6:44 AM

## 2015-07-06 NOTE — Care Management Important Message (Signed)
Important Message  Patient Details  Name: Douglas Holmes MRN: [id number] Date of Birth: 04-10-1945   Medicare Important Message Given:  Yes    Loann Quill 07/06/2015, 1:55 PM

## 2015-07-07 NOTE — Discharge Instructions (Signed)
Small Bowel Obstruction °A small bowel obstruction is a blockage in the small bowel. The small bowel, which is also called the small intestine, is a long, slender tube that connects the stomach to the colon. When a person eats and drinks, food and fluids go from the stomach to the small bowel. This is where most of the nutrients in the food and fluids are absorbed. °A small bowel obstruction will prevent food and fluids from passing through the small bowel as they normally do during digestion. The small bowel can become partially or completely blocked. This can cause symptoms such as abdominal pain, vomiting, and bloating. If this condition is not treated, it can be dangerous because the small bowel could rupture. °CAUSES °Common causes of this condition include: °· Scar tissue from previous surgery or radiation treatment. °· Recent surgery. This may cause the movements of the bowel to slow down and cause food to block the intestine. °· Hernias. °· Inflammatory bowel disease (colitis). °· Twisting of the bowel (volvulus). °· Tumors. °· A foreign body. °· Slipping of a part of the bowel into another part (intussusception). °SYMPTOMS °Symptoms of this condition include: °· Abdominal pain. This may be dull cramps or sharp pain. It may occur in one area, or it may be present in the entire abdomen. Pain can range from mild to severe, depending on the degree of obstruction. °· Nausea and vomiting. Vomit may be greenish or a yellow bile color. °· Abdominal bloating. °· Constipation. °· Lack of passing gas. °· Frequent belching. °· Diarrhea. This may occur if the obstruction is partial and runny stool is able to leak around the obstruction. °DIAGNOSIS °This condition may be diagnosed based on a physical exam, medical history, and X-rays of the abdomen. You may also have other tests, such as a CT scan of the abdomen and pelvis. °TREATMENT °Treatment for this condition depends on the cause and severity of the problem.  Treatment options may include: °· Bed rest along with fluids and pain medicines that are given through an IV tube inserted into one of your veins. Sometimes, this is all that is needed for the obstruction to improve. °· Following a simple diet. In some cases, a clear liquid diet may be required for several days. This allows the bowel to rest. °· Placement of a small tube (nasogastric tube) into the stomach. When the bowel is blocked, it usually swells up like a balloon that is filled with air and fluids. The air and fluids may be removed by suction through the nasogastric tube. This can help with pain, discomfort, and nausea. It can also help the obstruction to clear up faster. °· Surgery. This may be required if other treatments do not work. Bowel obstruction from a hernia may require early surgery and can be an emergency procedure. Surgery may also be required for scar tissue that causes frequent or severe obstructions. °HOME CARE INSTRUCTIONS °· Get plenty of rest. °· Follow instructions from your health care provider about eating restrictions. You may need to avoid solid foods and consume only clear liquids until your condition improves. °· Take over-the-counter and prescription medicines only as told by your health care provider. °· Keep all follow-up visits as told by your health care provider. This is important. °SEEK MEDICAL CARE IF: °· You have a fever. °· You have chills. °SEEK IMMEDIATE MEDICAL CARE IF: °· You have increased pain or cramping. °· You vomit blood. °· You have uncontrolled vomiting or nausea. °· You cannot drink   fluids because of vomiting or pain. °· You develop confusion. °· You begin feeling very dry or thirsty (dehydrated). °· You have severe bloating. °· You feel extremely weak or you faint. °  °This information is not intended to replace advice given to you by your health care provider. Make sure you discuss any questions you have with your health care provider. °  °Document Released:  03/20/2005 Document Revised: 09/22/2014 Document Reviewed: 02/25/2014 °Elsevier Interactive Patient Education ©2016 [employer] ° ° °

## 2015-07-07 NOTE — Discharge Summary (Signed)
Physician Discharge Summary  Douglas Holmes DOB: 1945/03/29 DOA: 07/05/2015  PCP: Horatio Pel, MD  Admit date: 07/05/2015 Discharge date: 07/07/2015  Recommendations for Outpatient Follow-up:  1. Pt will need to follow up with PCP in 2-3 weeks post discharge 2. Please obtain BMP to evaluate electrolytes and kidney function 3. Please also check CBC to evaluate Hg and Hct levels  Discharge Diagnoses:  Principal Problem:   SBO (small bowel obstruction) (HCC) Active Problems:   History of bladder cancer   Enlarged prostate   History of diverticulosis   Small bowel obstruction ([place])  Discharge Condition: Stable  Diet recommendation: Heart healthy diet discussed in details   Brief Narrative:  70 y.o. gentleman with bladder cancer, BPH, partial colectomy for a history of diverticulitis, and prior SBO requiring LOA who feels that he was in his baseline state of health until Thursday. He started feeling "out of sorts" then, but he did not have specific symptoms until Monday, when he noted decreased appetite and abdominal distention.   Assessment & Plan:  Principal Problem:  SBO (small bowel obstruction) ([place]) - pt reported feeling much better, passing gas and had BM this AM - diet advanced per surgery team and pt tolerating regular diet well - pt wants to go home - surgery team ok with pt going home   Active Problems:  History of bladder cancer - outpatient follow up    Enlarged prostate - monitor urine output   DVT prophylaxis: Lovenox Sq Code Status: Full  Family Communication: Patient at bedside  Disposition Plan: Home   Consultants:   Surgery  Procedures:   None  Antimicrobials:   None  Discharge Exam: Filed Vitals:   07/06/15 2133 07/07/15 0531  BP: 118/72 99/57  Pulse: 68 84  Temp: 98.3 F (36.8 C) 98.6 F (37 C)  Resp: 18 17   Filed Vitals:   07/06/15 0505 07/06/15 1348 07/06/15 2133 07/07/15 0531  BP: 108/65  118/72 118/72 99/57  Pulse: 93 74 68 84  Temp: 98.6 F (37 C) 97.4 F (36.3 C) 98.3 F (36.8 C) 98.6 F (37 C)  TempSrc: Oral Oral Oral Oral  Resp:  18 18 17   Height:      Weight:      SpO2: 93% 99% 97% 98%    General: Pt is alert, follows commands appropriately, not in acute distress Cardiovascular: Regular rate and rhythm, S1/S2 +, no rubs, no gallops Respiratory: Clear to auscultation bilaterally, no wheezing, no crackles, no rhonchi Abdominal: Soft, non tender, non distended, bowel sounds +, no guarding Extremities: no edema, no cyanosis, pulses palpable bilaterally DP and PT Neuro: Grossly nonfocal  Discharge Instructions     Medication List    TAKE these medications        aspirin 81 MG tablet  Take 81 mg by mouth daily.     atorvastatin 20 MG tablet  Commonly known as:  LIPITOR  Take 1 tablet by mouth every evening.     FIBER PO  Take 2 capsules by mouth daily.     glucosamine-chondroitin 500-400 MG tablet  Take 1 tablet by mouth daily.     KRILL OIL PO  Take 1 tablet by mouth daily.     multivitamin tablet  Take 1 tablet by mouth daily.           Follow-up Information    Follow up with Horatio Pel, MD.   Specialty:  Internal Medicine   Contact information:   [address]  [place] [address] [phone]       Call Faye Ramsay, MD.   Specialty:  Internal Medicine   Why:  As needed   Contact information:   [address] [place]  [address] [phone]        The results of significant diagnostics from this hospitalization (including imaging, microbiology, ancillary and laboratory) are listed below for reference.     Microbiology: No results found for this or any previous visit (from the past 240 hour(s)).   Labs: Basic Metabolic Panel:  Recent Labs Lab 07/05/15 1734  NA 135  K 3.8  CL 103  CO2 24  GLUCOSE 114*  BUN 9  CREATININE 0.76  CALCIUM 9.3   Liver  Function Tests:  Recent Labs Lab 07/05/15 1734  AST 19  ALT 18  ALKPHOS 55  BILITOT 1.1  PROT 7.3  ALBUMIN 3.7    Recent Labs Lab 07/05/15 1734  LIPASE 23   CBC:  Recent Labs Lab 07/05/15 1734  WBC 10.5  HGB 15.5  HCT 45.6  MCV 87.4  PLT 253   SIGNED: Time coordinating discharge: 30 minutes  Faye Ramsay, MD  [facility] 07/07/2015, 11:41 AM Pager [phone]  If 7PM-7AM, please contact night-coverage [email or web address] Password [email or web address]

## 2015-07-07 NOTE — Progress Notes (Signed)
Patient ID: Douglas Holmes, male   DOB: 25-Mar-1945, 70 y.o.   MRN: [id number]   LOS: 2 days   Subjective: Doing well, denies pain, N/V. +flatus/BM's. Wants to go home today if possible.   Objective: Vital signs in last 24 hours: Temp:  [97.4 F (36.3 C)-98.6 F (37 C)] 98.6 F (37 C) (06/22 0531) Pulse Rate:  [68-84] 84 (06/22 0531) Resp:  [17-18] 17 (06/22 0531) BP: (99-118)/(57-72) 99/57 mmHg (06/22 0531) SpO2:  [97 %-99 %] 98 % (06/22 0531) Last BM Date: 07/05/15   Physical Exam General appearance: alert and no distress Resp: clear to auscultation bilaterally Cardio: regular rate and rhythm GI: normal findings: bowel sounds normal and soft, non-tender   Assessment/Plan: SBO -- Will give regular diet for lunch, if tolerates will d/c home this afternoon.    Lisette Abu, PA-C Pager: [phone] 07/07/2015

## 2015-07-15 DIAGNOSIS — K565 Intestinal adhesions [bands] with obstruction (postprocedural) (postinfection): Secondary | ICD-10-CM | POA: Diagnosis not present

## 2015-07-20 DIAGNOSIS — Z8719 Personal history of other diseases of the digestive system: Secondary | ICD-10-CM | POA: Diagnosis not present

## 2015-08-26 DIAGNOSIS — Z0100 Encounter for examination of eyes and vision without abnormal findings: Secondary | ICD-10-CM | POA: Diagnosis not present

## 2015-08-26 DIAGNOSIS — H2513 Age-related nuclear cataract, bilateral: Secondary | ICD-10-CM | POA: Diagnosis not present

## 2015-09-09 ENCOUNTER — Other Ambulatory Visit: Payer: Self-pay

## 2015-09-27 DIAGNOSIS — N400 Enlarged prostate without lower urinary tract symptoms: Secondary | ICD-10-CM | POA: Diagnosis not present

## 2015-09-27 DIAGNOSIS — Z8551 Personal history of malignant neoplasm of bladder: Secondary | ICD-10-CM | POA: Diagnosis not present

## 2015-10-03 DIAGNOSIS — E7800 Pure hypercholesterolemia, unspecified: Secondary | ICD-10-CM | POA: Diagnosis not present

## 2015-10-03 DIAGNOSIS — Z7982 Long term (current) use of aspirin: Secondary | ICD-10-CM | POA: Diagnosis not present

## 2015-10-06 DIAGNOSIS — Z8551 Personal history of malignant neoplasm of bladder: Secondary | ICD-10-CM | POA: Diagnosis not present

## 2015-10-06 DIAGNOSIS — L821 Other seborrheic keratosis: Secondary | ICD-10-CM | POA: Diagnosis not present

## 2015-10-06 DIAGNOSIS — G249 Dystonia, unspecified: Secondary | ICD-10-CM | POA: Diagnosis not present

## 2015-10-06 DIAGNOSIS — L918 Other hypertrophic disorders of the skin: Secondary | ICD-10-CM | POA: Diagnosis not present

## 2015-10-06 DIAGNOSIS — E7800 Pure hypercholesterolemia, unspecified: Secondary | ICD-10-CM | POA: Diagnosis not present

## 2015-10-06 DIAGNOSIS — Z6830 Body mass index (BMI) 30.0-30.9, adult: Secondary | ICD-10-CM | POA: Diagnosis not present

## 2015-10-06 DIAGNOSIS — Z23 Encounter for immunization: Secondary | ICD-10-CM | POA: Diagnosis not present

## 2015-10-06 DIAGNOSIS — Z801 Family history of malignant neoplasm of trachea, bronchus and lung: Secondary | ICD-10-CM | POA: Diagnosis not present

## 2015-10-07 ENCOUNTER — Telehealth: Payer: Self-pay | Admitting: Gastroenterology

## 2015-12-14 NOTE — Telephone Encounter (Signed)
Dr. [name] reviewed records and has accepted patient. Ok to schedule Direct Colon. Left message for patient to return my call.

## 2015-12-21 ENCOUNTER — Telehealth: Payer: Self-pay | Admitting: Gastroenterology

## 2015-12-21 NOTE — Telephone Encounter (Signed)
Rec'd from [place] forward 27 pages to [place]

## 2016-01-11 NOTE — Telephone Encounter (Signed)
Patient has not called to schedule. Records will be in "records reviewed" folder

## 2016-02-17 ENCOUNTER — Encounter: Payer: Self-pay | Admitting: Gastroenterology

## 2016-03-20 DIAGNOSIS — Z8551 Personal history of malignant neoplasm of bladder: Secondary | ICD-10-CM | POA: Diagnosis not present

## 2016-03-22 ENCOUNTER — Ambulatory Visit (AMBULATORY_SURGERY_CENTER): Payer: Self-pay

## 2016-03-22 VITALS — Ht 74.0 in | Wt 238.0 lb

## 2016-03-22 DIAGNOSIS — Z860100 Personal history of colon polyps, unspecified: Secondary | ICD-10-CM

## 2016-03-22 MED ORDER — SUPREP BOWEL PREP KIT 17.5-3.13-1.6 GM/177ML PO SOLN: 1.0000 | Freq: Once | ORAL | 0 refills | Status: AC

## 2016-03-22 NOTE — Progress Notes (Signed)
No allergies to eggs or soy No diet meds No home oxygen No past problems with anesthesia  Declined emmi 

## 2016-04-05 ENCOUNTER — Encounter: Payer: Self-pay | Admitting: Gastroenterology

## 2016-04-05 ENCOUNTER — Ambulatory Visit (AMBULATORY_SURGERY_CENTER): Payer: Medicare Other | Admitting: Gastroenterology

## 2016-04-05 VITALS — BP 105/70 | HR 69 | Temp 98.4°F | Resp 12 | Ht 74.0 in | Wt 238.0 lb

## 2016-04-05 DIAGNOSIS — K5730 Diverticulosis of large intestine without perforation or abscess without bleeding: Secondary | ICD-10-CM | POA: Diagnosis not present

## 2016-04-05 DIAGNOSIS — Z86010 Personal history of colonic polyps: Secondary | ICD-10-CM | POA: Diagnosis not present

## 2016-04-05 MED ORDER — SODIUM CHLORIDE 0.9 % IV SOLN: 500.0000 mL | INTRAVENOUS | Status: DC

## 2016-04-05 NOTE — Patient Instructions (Signed)
Impressions/recommendations:  Diverticulosis (handout given)  Repeat colonoscopy in 5 years.  YOU HAD AN ENDOSCOPIC PROCEDURE TODAY AT [facility]:   Refer to the procedure report that was given to you for any specific questions about what was found during the examination.  If the procedure report does not answer your questions, please call your gastroenterologist to clarify.  If you requested that your care partner not be given the details of your procedure findings, then the procedure report has been included in a sealed envelope for you to review at your convenience later.  YOU SHOULD EXPECT: Some feelings of bloating in the abdomen. Passage of more gas than usual.  Walking can help get rid of the air that was put into your GI tract during the procedure and reduce the bloating. If you had a lower endoscopy (such as a colonoscopy or flexible sigmoidoscopy) you may notice spotting of blood in your stool or on the toilet paper. If you underwent a bowel prep for your procedure, you may not have a normal bowel movement for a few days.  Please Note:  You might notice some irritation and congestion in your nose or some drainage.  This is from the oxygen used during your procedure.  There is no need for concern and it should clear up in a day or so.  SYMPTOMS TO REPORT IMMEDIATELY:   Following lower endoscopy (colonoscopy or flexible sigmoidoscopy):  Excessive amounts of blood in the stool  Significant tenderness or worsening of abdominal pains  Swelling of the abdomen that is new, acute  Fever of 100F or higher  For urgent or emergent issues, a gastroenterologist can be reached at any hour by calling [phone].   DIET:  We do recommend a small meal at first, but then you may proceed to your regular diet.  Drink plenty of fluids but you should avoid alcoholic beverages for 24 hours.  ACTIVITY:  You should plan to take it easy for the rest of today and you should NOT DRIVE  or use heavy machinery until tomorrow (because of the sedation medicines used during the test).    FOLLOW UP: Our staff will call the number listed on your records the next business day following your procedure to check on you and address any questions or concerns that you may have regarding the information given to you following your procedure. If we do not reach you, we will leave a message.  However, if you are feeling well and you are not experiencing any problems, there is no need to return our call.  We will assume that you have returned to your regular daily activities without incident.  If any biopsies were taken you will be contacted by phone or by letter within the next 1-3 weeks.  Please call us at [phone] if you have not heard about the biopsies in 3 weeks.    SIGNATURES/CONFIDENTIALITY: You and/or your care partner have signed paperwork which will be entered into your electronic medical record.  These signatures attest to the fact that that the information above on your After Visit Summary has been reviewed and is understood.  Full responsibility of the confidentiality of this discharge information lies with you and/or your care-partner.

## 2016-04-05 NOTE — Progress Notes (Signed)
To PACU, vss patent aw report to rn 

## 2016-04-05 NOTE — Op Note (Signed)
Douglas Holmes Patient Name: Douglas Holmes Procedure Date: 04/05/2016 1:12 PM MRN: [id number] Endoscopist: Mallie Mussel L. Douglas Holmes , MD Age: 71 Referring MD:  Date of Birth: May 06, 1945 Gender: Male Account #: [id number] Procedure:                Colonoscopy Indications:              Surveillance: Personal history of adenomatous                            polyps on last colonoscopy > 3 years ago (3 subcm                            tubular adenomas 05/2012) Medicines:                Monitored Anesthesia Care Procedure:                Pre-Anesthesia Assessment:                           - Prior to the procedure, a History and Physical                            was performed, and patient medications and                            allergies were reviewed. The patient's tolerance of                            previous anesthesia was also reviewed. The risks                            and benefits of the procedure and the sedation                            options and risks were discussed with the patient.                            All questions were answered, and informed consent                            was obtained. Anticoagulants: The patient has taken                            aspirin. It was decided not to withhold this                            medication prior to the procedure. ASA Grade                            Assessment: II - A patient with mild systemic                            disease. After reviewing the risks and benefits,  the patient was deemed in satisfactory condition to                            undergo the procedure.                           After obtaining informed consent, the colonoscope                            was passed under direct vision. Throughout the                            procedure, the patient's blood pressure, pulse, and                            oxygen saturations were monitored continuously. The              Colonoscope was introduced through the anus and                            advanced to the the cecum, identified by                            appendiceal orifice and ileocecal valve. The                            colonoscopy was performed without difficulty. The                            patient tolerated the procedure well. The quality                            of the bowel preparation was good. The ileocecal                            valve, appendiceal orifice, and rectum were                            photographed. The quality of the bowel preparation                            was evaluated using the BBPS [facility] Bowel                            Preparation Scale) with scores of: Right Colon = 2,                            Transverse Colon = 2 and Left Colon = 2. The total                            BBPS score equals 6. After lavage. The bowel                            preparation used was  SUPREP. Scope In: 1:25:13 PM Scope Out: 1:36:26 PM Scope Withdrawal Time: 0 hours 9 minutes 58 seconds  Total Procedure Duration: 0 hours 11 minutes 13 seconds  Findings:                 The perianal and digital rectal examinations were                            normal.                           Many large-mouthed diverticula were found in the                            entire colon.                           The exam was otherwise without abnormality on                            direct and retroflexion views. Complications:            No immediate complications. Estimated Blood Loss:     Estimated blood loss: none. Impression:               - Diverticulosis in the entire examined colon.                           - The examination was otherwise normal on direct                            and retroflexion views.                           - No specimens collected. Recommendation:           - Patient has a contact number available for                            emergencies. The  signs and symptoms of potential                            delayed complications were discussed with the                            patient. Return to normal activities tomorrow.                            Written discharge instructions were provided to the                            patient.                           - Resume previous diet.                           - Continue present medications.                           -  Repeat colonoscopy in 5 years due to family                            history of colon cancer in patient's mother. Henry L. Douglas Carrow, MD 04/05/2016 1:41:34 PM This report has been signed electronically.

## 2016-04-05 NOTE — Progress Notes (Signed)
Pt's states no medical or surgical changes since previsit or office visit. maw 

## 2016-04-06 ENCOUNTER — Telehealth: Payer: Self-pay | Admitting: *Deleted

## 2016-04-06 NOTE — Telephone Encounter (Signed)
  Follow up Call-  Call back number 04/05/2016  Post procedure Call Back phone  # #[phone] cell  Permission to leave phone message Yes  Some recent data might be hidden     Patient questions:  Do you have a fever, pain , or abdominal swelling? No. Pain Score  0 *  Have you tolerated food without any problems? Yes.    Have you been able to return to your normal activities? Yes.    Do you have any questions about your discharge instructions: Diet   No. Medications  No. Follow up visit  No.  Do you have questions or concerns about your Care? No.  Actions: * If pain score is 4 or above: No action needed, pain <4.

## 2016-05-17 DIAGNOSIS — L03031 Cellulitis of right toe: Secondary | ICD-10-CM | POA: Diagnosis not present

## 2016-05-17 DIAGNOSIS — L03032 Cellulitis of left toe: Secondary | ICD-10-CM | POA: Diagnosis not present

## 2016-06-28 DIAGNOSIS — H5203 Hypermetropia, bilateral: Secondary | ICD-10-CM | POA: Diagnosis not present

## 2016-06-28 DIAGNOSIS — H524 Presbyopia: Secondary | ICD-10-CM | POA: Diagnosis not present

## 2016-06-28 DIAGNOSIS — H52203 Unspecified astigmatism, bilateral: Secondary | ICD-10-CM | POA: Diagnosis not present

## 2016-06-28 DIAGNOSIS — H2513 Age-related nuclear cataract, bilateral: Secondary | ICD-10-CM | POA: Diagnosis not present

## 2016-10-23 DIAGNOSIS — Z7982 Long term (current) use of aspirin: Secondary | ICD-10-CM | POA: Diagnosis not present

## 2016-10-23 DIAGNOSIS — E7800 Pure hypercholesterolemia, unspecified: Secondary | ICD-10-CM | POA: Diagnosis not present

## 2016-10-23 DIAGNOSIS — Z23 Encounter for immunization: Secondary | ICD-10-CM | POA: Diagnosis not present

## 2016-10-23 DIAGNOSIS — Z125 Encounter for screening for malignant neoplasm of prostate: Secondary | ICD-10-CM | POA: Diagnosis not present

## 2016-10-23 DIAGNOSIS — Z0000 Encounter for general adult medical examination without abnormal findings: Secondary | ICD-10-CM | POA: Diagnosis not present

## 2016-10-25 DIAGNOSIS — N529 Male erectile dysfunction, unspecified: Secondary | ICD-10-CM | POA: Diagnosis not present

## 2016-10-25 DIAGNOSIS — I2510 Atherosclerotic heart disease of native coronary artery without angina pectoris: Secondary | ICD-10-CM | POA: Diagnosis not present

## 2016-10-25 DIAGNOSIS — M159 Polyosteoarthritis, unspecified: Secondary | ICD-10-CM | POA: Diagnosis not present

## 2016-10-25 DIAGNOSIS — H9190 Unspecified hearing loss, unspecified ear: Secondary | ICD-10-CM | POA: Diagnosis not present

## 2016-10-25 DIAGNOSIS — N401 Enlarged prostate with lower urinary tract symptoms: Secondary | ICD-10-CM | POA: Diagnosis not present

## 2016-10-25 DIAGNOSIS — E7800 Pure hypercholesterolemia, unspecified: Secondary | ICD-10-CM | POA: Diagnosis not present

## 2016-10-25 DIAGNOSIS — L821 Other seborrheic keratosis: Secondary | ICD-10-CM | POA: Diagnosis not present

## 2016-10-25 DIAGNOSIS — G249 Dystonia, unspecified: Secondary | ICD-10-CM | POA: Diagnosis not present

## 2016-10-25 DIAGNOSIS — N200 Calculus of kidney: Secondary | ICD-10-CM | POA: Diagnosis not present

## 2016-10-25 DIAGNOSIS — K5900 Constipation, unspecified: Secondary | ICD-10-CM | POA: Diagnosis not present

## 2016-10-25 DIAGNOSIS — Z86010 Personal history of colonic polyps: Secondary | ICD-10-CM | POA: Diagnosis not present

## 2016-10-25 DIAGNOSIS — K5730 Diverticulosis of large intestine without perforation or abscess without bleeding: Secondary | ICD-10-CM | POA: Diagnosis not present

## 2016-11-01 DIAGNOSIS — N400 Enlarged prostate without lower urinary tract symptoms: Secondary | ICD-10-CM | POA: Diagnosis not present

## 2016-11-01 DIAGNOSIS — Z8551 Personal history of malignant neoplasm of bladder: Secondary | ICD-10-CM | POA: Diagnosis not present

## 2016-12-13 ENCOUNTER — Other Ambulatory Visit: Payer: Self-pay | Admitting: Gastroenterology

## 2016-12-13 DIAGNOSIS — Z86010 Personal history of colonic polyps: Secondary | ICD-10-CM

## 2017-02-25 DIAGNOSIS — E7800 Pure hypercholesterolemia, unspecified: Secondary | ICD-10-CM | POA: Diagnosis not present

## 2017-03-21 DIAGNOSIS — R748 Abnormal levels of other serum enzymes: Secondary | ICD-10-CM | POA: Diagnosis not present

## 2017-04-23 DIAGNOSIS — Z8551 Personal history of malignant neoplasm of bladder: Secondary | ICD-10-CM | POA: Diagnosis not present

## 2017-04-23 DIAGNOSIS — N400 Enlarged prostate without lower urinary tract symptoms: Secondary | ICD-10-CM | POA: Diagnosis not present

## 2017-07-01 DIAGNOSIS — H5203 Hypermetropia, bilateral: Secondary | ICD-10-CM | POA: Diagnosis not present

## 2017-07-01 DIAGNOSIS — H2513 Age-related nuclear cataract, bilateral: Secondary | ICD-10-CM | POA: Diagnosis not present

## 2017-10-25 DIAGNOSIS — Z7982 Long term (current) use of aspirin: Secondary | ICD-10-CM | POA: Diagnosis not present

## 2017-10-25 DIAGNOSIS — Z125 Encounter for screening for malignant neoplasm of prostate: Secondary | ICD-10-CM | POA: Diagnosis not present

## 2017-10-25 DIAGNOSIS — E7800 Pure hypercholesterolemia, unspecified: Secondary | ICD-10-CM | POA: Diagnosis not present

## 2017-10-30 DIAGNOSIS — Z23 Encounter for immunization: Secondary | ICD-10-CM | POA: Diagnosis not present

## 2017-10-30 DIAGNOSIS — N529 Male erectile dysfunction, unspecified: Secondary | ICD-10-CM | POA: Diagnosis not present

## 2017-10-30 DIAGNOSIS — L821 Other seborrheic keratosis: Secondary | ICD-10-CM | POA: Diagnosis not present

## 2017-10-30 DIAGNOSIS — E7800 Pure hypercholesterolemia, unspecified: Secondary | ICD-10-CM | POA: Diagnosis not present

## 2017-10-30 DIAGNOSIS — Z0000 Encounter for general adult medical examination without abnormal findings: Secondary | ICD-10-CM | POA: Diagnosis not present

## 2017-10-30 DIAGNOSIS — N401 Enlarged prostate with lower urinary tract symptoms: Secondary | ICD-10-CM | POA: Diagnosis not present

## 2017-10-30 DIAGNOSIS — G249 Dystonia, unspecified: Secondary | ICD-10-CM | POA: Diagnosis not present

## 2017-10-30 DIAGNOSIS — H9190 Unspecified hearing loss, unspecified ear: Secondary | ICD-10-CM | POA: Diagnosis not present

## 2017-10-30 DIAGNOSIS — Z801 Family history of malignant neoplasm of trachea, bronchus and lung: Secondary | ICD-10-CM | POA: Diagnosis not present

## 2017-10-30 DIAGNOSIS — N200 Calculus of kidney: Secondary | ICD-10-CM | POA: Diagnosis not present

## 2017-10-30 DIAGNOSIS — M159 Polyosteoarthritis, unspecified: Secondary | ICD-10-CM | POA: Diagnosis not present

## 2017-10-30 DIAGNOSIS — I2510 Atherosclerotic heart disease of native coronary artery without angina pectoris: Secondary | ICD-10-CM | POA: Diagnosis not present

## 2017-11-04 DIAGNOSIS — N400 Enlarged prostate without lower urinary tract symptoms: Secondary | ICD-10-CM | POA: Diagnosis not present

## 2017-11-14 DIAGNOSIS — Z8551 Personal history of malignant neoplasm of bladder: Secondary | ICD-10-CM | POA: Diagnosis not present

## 2017-11-14 DIAGNOSIS — N400 Enlarged prostate without lower urinary tract symptoms: Secondary | ICD-10-CM | POA: Diagnosis not present

## 2017-11-14 DIAGNOSIS — Z125 Encounter for screening for malignant neoplasm of prostate: Secondary | ICD-10-CM | POA: Diagnosis not present

## 2018-01-03 IMAGING — CR DG ABD PORTABLE 1V
3 series · 3 of 3 positions shown · non-contrast
Comparison: 07/05/2015

CLINICAL DATA: Small bowel obstruction, history of kidney stones

EXAM:
PORTABLE ABDOMEN - 1 VIEW

[AP (1 of 3)]
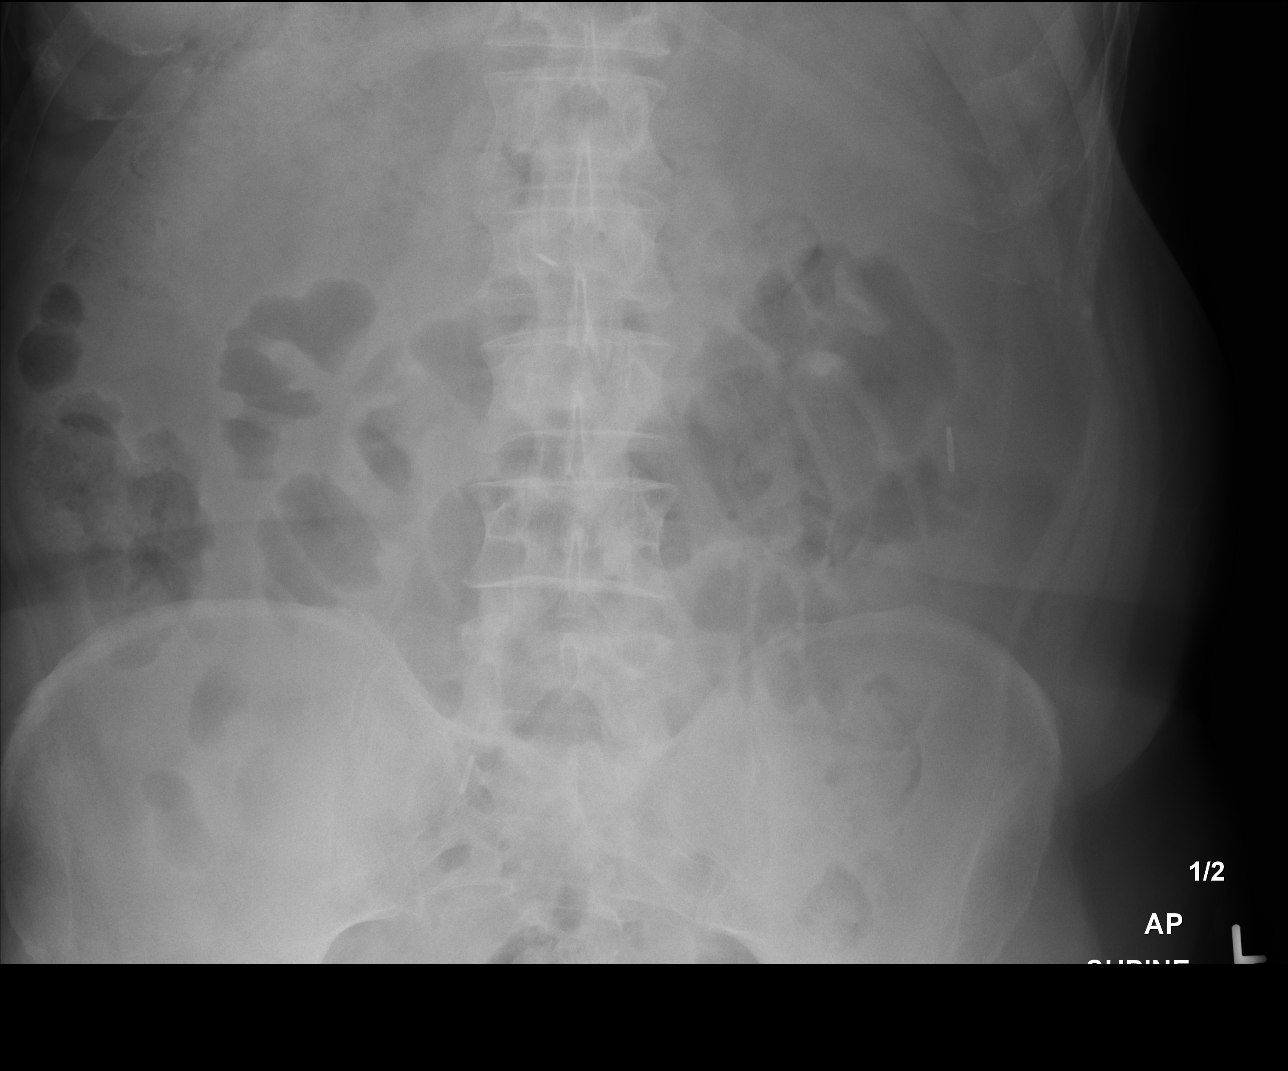

[AP (2 of 3)]
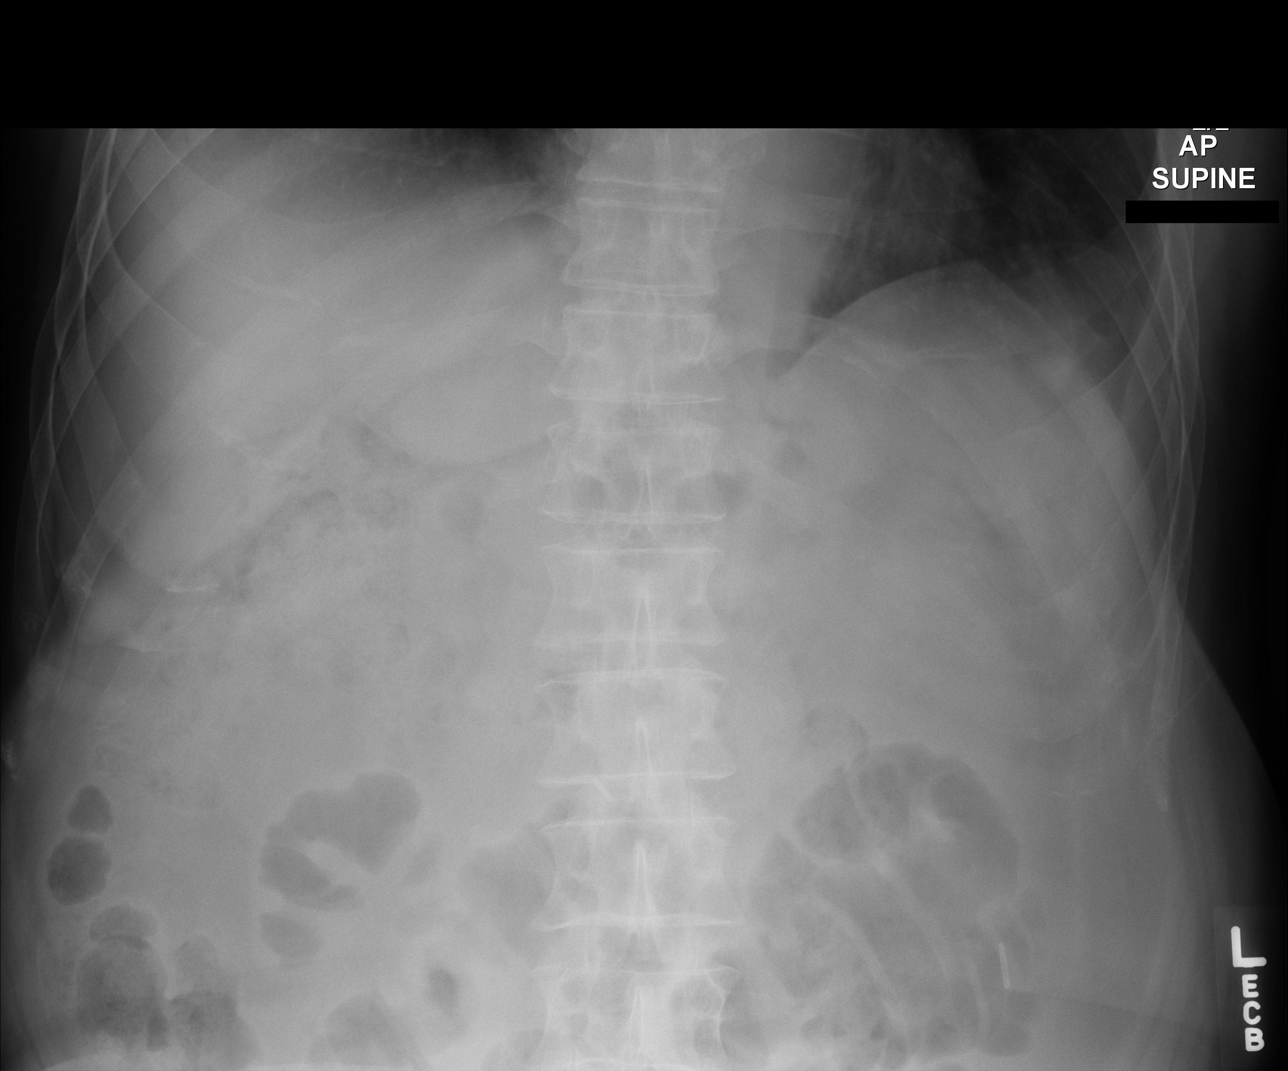

[AP (3 of 3)]
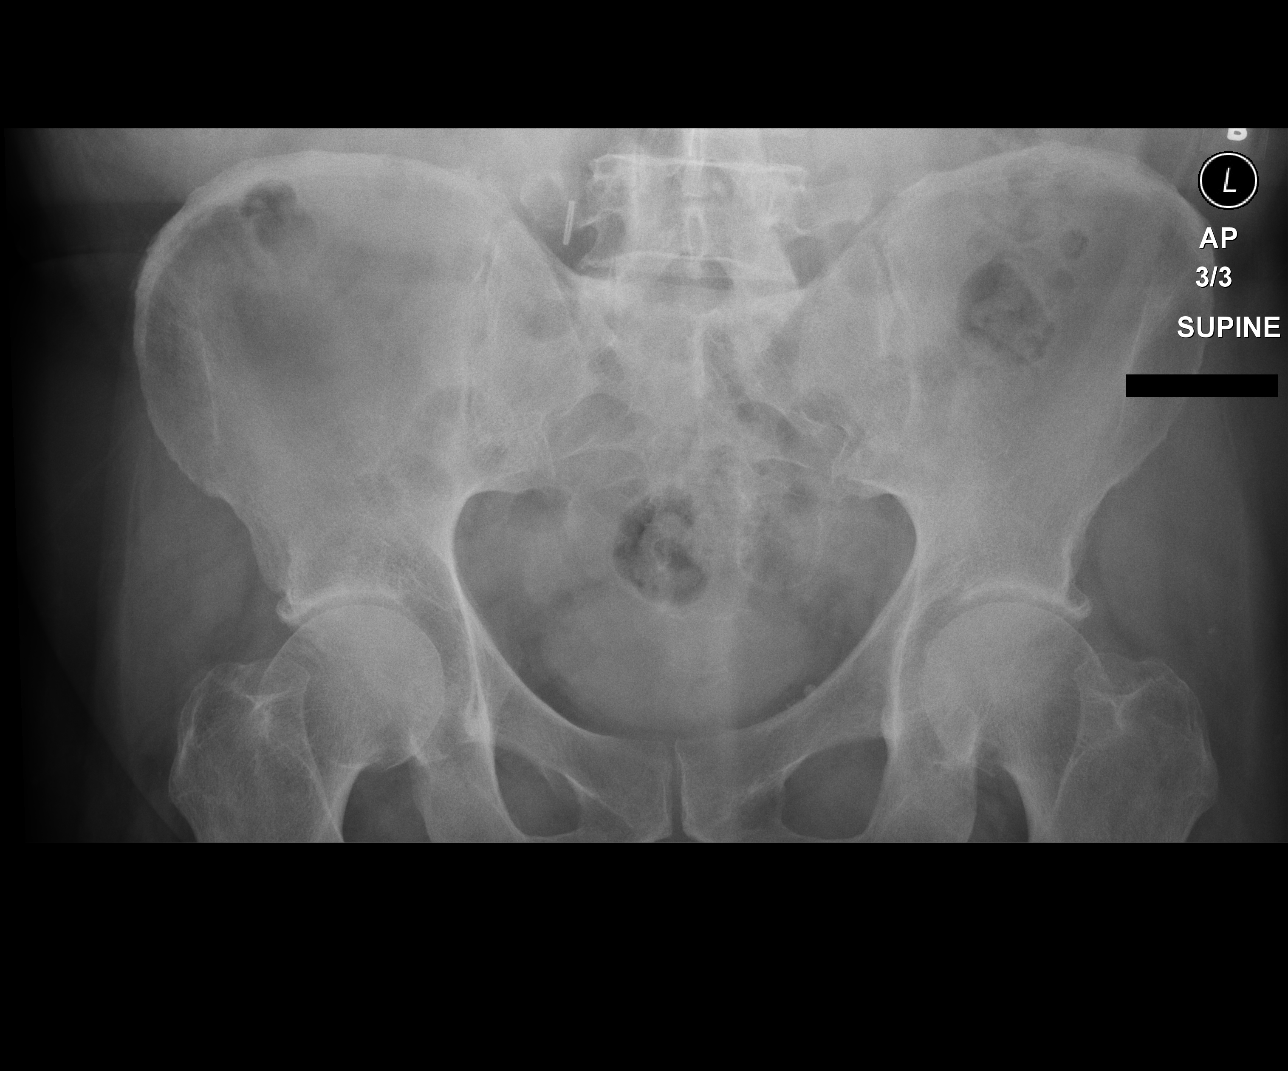

[3 of 3 positions shown; findings below may reference images not displayed]

FINDINGS: Persistent mild gaseous distended small bowel loops left mid abdomen
suspicious for at least partial small bowel obstruction. Some
colonic gas noted in right colon and transverse colon.
IMPRESSION: Persistent mild gaseous distended small bowel loops in left mid
abdomen suspicious for partial small bowel obstruction or ileus.
Some colonic gas noted in right colon and transverse colon.

## 2018-03-25 DIAGNOSIS — K4090 Unilateral inguinal hernia, without obstruction or gangrene, not specified as recurrent: Secondary | ICD-10-CM | POA: Diagnosis not present

## 2018-04-16 DIAGNOSIS — K4090 Unilateral inguinal hernia, without obstruction or gangrene, not specified as recurrent: Secondary | ICD-10-CM | POA: Diagnosis not present

## 2018-06-20 ENCOUNTER — Ambulatory Visit: Payer: Self-pay | Admitting: Surgery

## 2018-06-30 NOTE — Patient Instructions (Addendum)
YOU ARE REQUIRED TO BE TESTED FOR COVID-19 PRIOR TO YOUR SURGERY . YOUR TEST MUST BE COMPLETED ON Tuesday June 16th . TESTING IS LOCATED AT [facility] ENTR[facility] FROM 9:00AM - 3:00PM. FAILURE TO COMPLETE TESTING MAY RESULT IN CANCELLATION OF YOUR SURGERY. ONCE YOUR COVID TEST IS COMPLETED, PLEASE BEGIN THE QUARANTINE INSTRUCTIONS AS OUTLINED IN YOUR HANDOUT.                Douglas Holmes    Your procedure is scheduled on: 07-04-2018   Report to [facility] Main  Entrance    Report to admitting at 7:30 AM      Call this number if you have problems the morning of surgery [phone]      Remember: Do not eat food or drink liquids :After Midnight. BRUSH YOUR TEETH MORNING OF SURGERY AND RINSE YOUR MOUTH OUT, NO CHEWING GUM CANDY OR MINTS.     Take these medicines the morning of surgery with A SIP OF WATER: NONE                               You may not have any metal on your body including hair pins and              piercings  Do not wear jewelry, make-up, lotions, powders or perfumes, deodorant                Men may shave face and neck.   Do not bring valuables to the hospital. [place].  Contacts, dentures or bridgework may not be worn into surgery.  Leave suitcase in the car. After surgery it may be brought to your room.     Patients discharged the day of surgery will not be allowed to drive home. IF YOU ARE HAVING SURGERY AND GOING HOME THE SAME DAY, YOU MUST HAVE AN ADULT TO DRIVE YOU HOME AND BE WITH YOU FOR 24 HOURS. YOU MAY GO HOME BY TAXI OR UBER OR ORTHERWISE, BUT AN ADULT MUST ACCOMPANY YOU HOME AND STAY WITH YOU FOR 24 HOURS.  Name and phone number of your driver:  Special Instructions: N/A              Please read over the following fact sheets you were given: _____________________________________________________________________             [facility] - Preparing for  Surgery Before surgery, you can play an important role.  Because skin is not sterile, your skin needs to be as free of germs as possible.  You can reduce the number of germs on your skin by washing with CHG (chlorahexidine gluconate) soap before surgery.  CHG is an antiseptic cleaner which kills germs and bonds with the skin to continue killing germs even after washing. Please DO NOT use if you have an allergy to CHG or antibacterial soaps.  If your skin becomes reddened/irritated stop using the CHG and inform your nurse when you arrive at Short Stay. Do not shave (including legs and underarms) for at least 48 hours prior to the first CHG shower.  You may shave your face/neck. Please follow these instructions carefully:  1.  Shower with CHG Soap the night before surgery and the  morning of Surgery.  2.  If you choose to wash your hair,  wash your hair first as usual with your  normal  shampoo.  3.  After you shampoo, rinse your hair and body thoroughly to remove the  shampoo.                           4.  Use CHG as you would any other liquid soap.  You can apply chg directly  to the skin and wash                       Gently with a scrungie or clean washcloth.  5.  Apply the CHG Soap to your body ONLY FROM THE NECK DOWN.   Do not use on face/ open                           Wound or open sores. Avoid contact with eyes, ears mouth and genitals (private parts).                       Wash face,  Genitals (private parts) with your normal soap.             6.  Wash thoroughly, paying special attention to the area where your surgery  will be performed.  7.  Thoroughly rinse your body with warm water from the neck down.  8.  DO NOT shower/wash with your normal soap after using and rinsing off  the CHG Soap.                9.  Pat yourself dry with a clean towel.            10.  Wear clean pajamas.            11.  Place clean sheets on your bed the night of your first shower and do not  sleep with pets. Day  of Surgery : Do not apply any lotions/deodorants the morning of surgery.  Please wear clean clothes to the hospital/surgery center.  FAILURE TO FOLLOW THESE INSTRUCTIONS MAY RESULT IN THE CANCELLATION OF YOUR SURGERY PATIENT SIGNATURE_________________________________  NURSE SIGNATURE__________________________________  ________________________________________________________________________

## 2018-07-01 ENCOUNTER — Encounter (INDEPENDENT_AMBULATORY_CARE_PROVIDER_SITE_OTHER): Payer: Self-pay

## 2018-07-01 ENCOUNTER — Encounter (HOSPITAL_COMMUNITY): Payer: Self-pay

## 2018-07-01 ENCOUNTER — Encounter (HOSPITAL_COMMUNITY)
Admission: RE | Admit: 2018-07-01 | Discharge: 2018-07-01 | Disposition: A | Discharge status: Home / Self Care | Payer: Medicare Other | Source: Ambulatory Visit | Attending: Surgery | Admitting: Surgery

## 2018-07-01 ENCOUNTER — Other Ambulatory Visit (HOSPITAL_COMMUNITY)
Admission: RE | Admit: 2018-07-01 | Discharge: 2018-07-01 | Disposition: A | Discharge status: Home / Self Care | Payer: Medicare Other | Source: Ambulatory Visit | Attending: Surgery | Admitting: Surgery

## 2018-07-01 ENCOUNTER — Other Ambulatory Visit: Payer: Self-pay

## 2018-07-01 DIAGNOSIS — Z79899 Other long term (current) drug therapy: Secondary | ICD-10-CM | POA: Diagnosis not present

## 2018-07-01 DIAGNOSIS — Z87891 Personal history of nicotine dependence: Secondary | ICD-10-CM | POA: Diagnosis not present

## 2018-07-01 DIAGNOSIS — Z01812 Encounter for preprocedural laboratory examination: Secondary | ICD-10-CM | POA: Insufficient documentation

## 2018-07-01 DIAGNOSIS — Z1159 Encounter for screening for other viral diseases: Secondary | ICD-10-CM | POA: Insufficient documentation

## 2018-07-01 DIAGNOSIS — K4090 Unilateral inguinal hernia, without obstruction or gangrene, not specified as recurrent: Secondary | ICD-10-CM | POA: Diagnosis not present

## 2018-07-01 DIAGNOSIS — M1990 Unspecified osteoarthritis, unspecified site: Secondary | ICD-10-CM | POA: Diagnosis not present

## 2018-07-01 LAB — CBC
HCT: 46 % (ref 39.0–52.0)
Hemoglobin: 15.4 g/dL (ref 13.0–17.0)
MCH: 30.2 pg (ref 26.0–34.0)
MCHC: 33.5 g/dL (ref 30.0–36.0)
MCV: 90.2 fL (ref 80.0–100.0)
Platelets: 231 10*3/uL (ref 150–400)
RBC: 5.1 MIL/uL (ref 4.22–5.81)
RDW: 13.1 % (ref 11.5–15.5)
WBC: 6.8 10*3/uL (ref 4.0–10.5)
nRBC: 0 % (ref 0.0–0.2)

## 2018-07-01 NOTE — Progress Notes (Signed)
Appointment made for covid screen prior to 6/19 procedure.

## 2018-07-02 ENCOUNTER — Encounter (HOSPITAL_COMMUNITY): Payer: Self-pay | Admitting: Surgery

## 2018-07-02 DIAGNOSIS — K4090 Unilateral inguinal hernia, without obstruction or gangrene, not specified as recurrent: Secondary | ICD-10-CM

## 2018-07-02 LAB — NOVEL CORONAVIRUS, NAA (HOSP ORDER, SEND-OUT TO REF LAB; TAT 18-24 HRS): SARS-CoV-2, NAA: NOT DETECTED

## 2018-07-02 NOTE — H&P (Signed)
General Surgery [facility]  Douglas Holmes DOB: 1945-07-08 Married / Language: English / Race: White Male   History of Present Illness  The patient is a 73 year old male who presents with an inguinal hernia.  CHIEF COMPLAINT: right inguinal hernia  Patient is referred by Dr. Deland Pretty for evaluation and surgical management of right inguinal hernia. Patient first noted a right inguinal hernia several months ago. He noted a dull discomfort in the right inguinal region after being on his feet for prolonged period of time and with physical activity. He was evaluated by his primary care physician who diagnosed right inguinal hernia. Patient denies any signs or symptoms of obstruction. He has been wearing a truss for comfort. Past surgical history is notable for laparotomy for diverticular disease in 38 in [place]. He has had 2 subsequent exploratory laparotomies for small bowel obstruction. Patient has not had any prior hernia repairs. He presents today to discuss repair of right inguinal hernia.   Allergies No Known Allergies  [04/16/2018]: No Known Drug Allergies  [04/16/2018]: Allergies Reconciled   Medication History  Rosuvastatin Calcium (20MG  Tablet, Oral) Active. Medications Reconciled  Vitals  Weight: 239.6 lb Height: 74in Body Surface Area: 2.35 m Body Mass Index: 30.76 kg/m  Temp.: 98.23F (Oral)  Pulse: 98 (Regular)  P.OX: 89% (Room air) BP: 138/82(Sitting, Left Arm, Standard)   Physical Exam   Note: See vital signs recorded above  GENERAL APPEARANCE Development: normal Nutritional status: normal Gross deformities: none  SKIN Rash, lesions, ulcers: none Induration, erythema: none Nodules: none palpable  EYES Conjunctiva and lids: normal Pupils: equal and reactive Iris: normal bilaterally  EARS, NOSE, MOUTH, THROAT External ears: no lesion or deformity External nose: no lesion or deformity Hearing:  grossly normal Lips: no lesion or deformity Dentition: normal for age Oral mucosa: moist  NECK Symmetric: yes Trachea: midline Thyroid: no palpable nodules in the thyroid bed  CHEST Respiratory effort: normal Retraction or accessory muscle use: no Breath sounds: normal bilaterally Rales, rhonchi, wheeze: none  CARDIOVASCULAR Auscultation: regular rhythm, normal rate Murmurs: none Pulses: carotid and radial pulse 2+ palpable Lower extremity edema: none Lower extremity varicosities: none  ABDOMEN Distension: none Masses: none palpable Tenderness: none Hepatosplenomegaly: not present Hernia: not present Well-healed midline surgical incision, complex, no sign of incisional hernia.  GENITOURINARY Penis: no lesions Scrotum: no masses Palpation in the left inguinal canal with cough and Valsalva shows no sign of hernia. Palpation in the right inguinal canal shows a moderate sized right inguinal hernia. With manipulation this is reducible. It spontaneously prolapses. It augments with coughing Valsalva.  MUSCULOSKELETAL Station and gait: normal Digits and nails: no clubbing or cyanosis Muscle strength: grossly normal all extremities Range of motion: grossly normal all extremities Deformity: none  LYMPHATIC Cervical: none palpable Supraclavicular: none palpable  PSYCHIATRIC Oriented to person, place, and time: yes Mood and affect: normal for situation Judgment and insight: appropriate for situation    Assessment & Plan  INGUINAL HERNIA OF RIGHT SIDE WITHOUT OBSTRUCTION OR GANGRENE (K40.90)  Current Plans Pt Education - Pamphlet Given - Hernia Surgery: discussed with patient and provided information. Pt Education - CCS Mesh education: discussed with patient and provided information. Patient is referred by his primary care physician for evaluation of right inguinal hernia. Patient is provided with written literature on hernia surgery to review at home.  On physical  examination, the patient does have a moderate sized right inguinal hernia which is reducible. This has been  mildly symptomatic. He is wearing a truss with symptomatic improvement.  We discussed operative repair by open technique using mesh. Given the patient's previous abdominal surgical procedures, he would not be a candidate for laparoscopic surgery. Open repair would give him the strongest repair with the least chance of recurrence. We discussed the use of prosthetic mesh. We discussed doing this as an outpatient surgical procedure. We discussed the postoperative recovery and restrictions on his activities. The patient understands and wishes to proceed with surgery in the near future.  At this time, elective surgery of this type is being postponed due to the Covid-19 virus pandemic. Once this is over and the operating rooms allow scheduling of this type of procedure, we will contact the patient to arrange a date for his surgery.  The risks and benefits of the procedure have been discussed at length with the patient. The patient understands the proposed procedure, potential alternative treatments, and the course of recovery to be expected. All of the patient's questions have been answered at this time. The patient wishes to proceed with surgery.  Armandina Gemma, [facility] Office: [phone]

## 2018-07-04 ENCOUNTER — Ambulatory Visit (HOSPITAL_COMMUNITY): Payer: Medicare Other | Admitting: Physician Assistant

## 2018-07-04 ENCOUNTER — Ambulatory Visit (HOSPITAL_COMMUNITY): Payer: Medicare Other | Admitting: Anesthesiology

## 2018-07-04 ENCOUNTER — Encounter (HOSPITAL_COMMUNITY)
Admission: RE | Disposition: A | Discharge status: Home / Self Care | Payer: Self-pay | Source: Home / Self Care | Attending: Surgery

## 2018-07-04 ENCOUNTER — Ambulatory Visit (HOSPITAL_COMMUNITY)
Admission: RE | Admit: 2018-07-04 | Discharge: 2018-07-04 | Disposition: A | Discharge status: Home / Self Care | Payer: Medicare Other | Attending: Surgery | Admitting: Surgery

## 2018-07-04 ENCOUNTER — Encounter (HOSPITAL_COMMUNITY): Payer: Self-pay | Admitting: *Deleted

## 2018-07-04 DIAGNOSIS — Z1159 Encounter for screening for other viral diseases: Secondary | ICD-10-CM | POA: Diagnosis not present

## 2018-07-04 DIAGNOSIS — N400 Enlarged prostate without lower urinary tract symptoms: Secondary | ICD-10-CM | POA: Diagnosis not present

## 2018-07-04 DIAGNOSIS — Z79899 Other long term (current) drug therapy: Secondary | ICD-10-CM | POA: Insufficient documentation

## 2018-07-04 DIAGNOSIS — K4090 Unilateral inguinal hernia, without obstruction or gangrene, not specified as recurrent: Secondary | ICD-10-CM | POA: Insufficient documentation

## 2018-07-04 DIAGNOSIS — M1990 Unspecified osteoarthritis, unspecified site: Secondary | ICD-10-CM | POA: Diagnosis not present

## 2018-07-04 DIAGNOSIS — G8918 Other acute postprocedural pain: Secondary | ICD-10-CM | POA: Diagnosis not present

## 2018-07-04 DIAGNOSIS — Z87891 Personal history of nicotine dependence: Secondary | ICD-10-CM | POA: Insufficient documentation

## 2018-07-04 HISTORY — PX: INGUINAL HERNIA REPAIR: SHX194

## 2018-07-04 SURGERY — REPAIR, HERNIA, INGUINAL, ADULT
Anesthesia: General | Site: Inguinal | Laterality: Right

## 2018-07-04 MED ORDER — BUPIVACAINE-EPINEPHRINE (PF) 0.25% -1:200000 IJ SOLN
INTRAMUSCULAR | Status: DC | PRN
  Administered 2018-07-04: 30 mL

## 2018-07-04 MED ORDER — BUPIVACAINE HCL (PF) 0.5 % IJ SOLN
INTRAMUSCULAR | Status: DC | PRN
  Administered 2018-07-04: 20 mL

## 2018-07-04 MED ORDER — SUCCINYLCHOLINE CHLORIDE 200 MG/10ML IV SOSY
PREFILLED_SYRINGE | INTRAVENOUS | Status: AC
  Filled 2018-07-04: qty 10

## 2018-07-04 MED ORDER — DEXAMETHASONE SODIUM PHOSPHATE 10 MG/ML IJ SOLN
INTRAMUSCULAR | Status: DC | PRN
  Administered 2018-07-04: 10 mg via INTRAVENOUS

## 2018-07-04 MED ORDER — LACTATED RINGERS IV SOLN
INTRAVENOUS | Status: DC | PRN
  Administered 2018-07-04: 08:00:00 via INTRAVENOUS

## 2018-07-04 MED ORDER — DEXAMETHASONE SODIUM PHOSPHATE 10 MG/ML IJ SOLN
INTRAMUSCULAR | Status: AC
  Filled 2018-07-04: qty 1

## 2018-07-04 MED ORDER — HYDROCODONE-ACETAMINOPHEN 5-325 MG PO TABS
1.0000 | ORAL_TABLET | Freq: Once | ORAL | Status: AC
  Administered 2018-07-04: 1 via ORAL

## 2018-07-04 MED ORDER — ROCURONIUM BROMIDE 10 MG/ML (PF) SYRINGE
PREFILLED_SYRINGE | INTRAVENOUS | Status: DC | PRN
  Administered 2018-07-04: 10 mg via INTRAVENOUS
  Administered 2018-07-04: 40 mg via INTRAVENOUS

## 2018-07-04 MED ORDER — SUCCINYLCHOLINE CHLORIDE 200 MG/10ML IV SOSY
PREFILLED_SYRINGE | INTRAVENOUS | Status: DC | PRN
  Administered 2018-07-04: 100 mg via INTRAVENOUS

## 2018-07-04 MED ORDER — FENTANYL CITRATE (PF) 100 MCG/2ML IJ SOLN
INTRAMUSCULAR | Status: AC
  Filled 2018-07-04: qty 2

## 2018-07-04 MED ORDER — CHLORHEXIDINE GLUCONATE CLOTH 2 % EX PADS: 6.0000 | MEDICATED_PAD | Freq: Once | CUTANEOUS | Status: DC

## 2018-07-04 MED ORDER — HYDROCODONE-ACETAMINOPHEN 5-325 MG PO TABS: 1.0000 | ORAL_TABLET | ORAL | 0 refills | Status: DC | PRN

## 2018-07-04 MED ORDER — 0.9 % SODIUM CHLORIDE (POUR BTL) OPTIME
TOPICAL | Status: DC | PRN
  Administered 2018-07-04: 10:00:00 1000 mL

## 2018-07-04 MED ORDER — LIDOCAINE 2% (20 MG/ML) 5 ML SYRINGE
INTRAMUSCULAR | Status: DC | PRN
  Administered 2018-07-04: 60 mg via INTRAVENOUS

## 2018-07-04 MED ORDER — LIDOCAINE 2% (20 MG/ML) 5 ML SYRINGE
INTRAMUSCULAR | Status: AC
  Filled 2018-07-04: qty 5

## 2018-07-04 MED ORDER — HYDROCODONE-ACETAMINOPHEN 5-325 MG PO TABS
ORAL_TABLET | ORAL | Status: AC
  Filled 2018-07-04: qty 1

## 2018-07-04 MED ORDER — FENTANYL CITRATE (PF) 100 MCG/2ML IJ SOLN
50.0000 ug | Freq: Once | INTRAMUSCULAR | Status: AC
  Administered 2018-07-04: 50 ug via INTRAVENOUS
  Filled 2018-07-04: qty 1

## 2018-07-04 MED ORDER — PROPOFOL 10 MG/ML IV BOLUS
INTRAVENOUS | Status: DC | PRN
  Administered 2018-07-04: 170 mg via INTRAVENOUS

## 2018-07-04 MED ORDER — LACTATED RINGERS IV SOLN
INTRAVENOUS | Status: DC
  Administered 2018-07-04: 08:00:00 via INTRAVENOUS

## 2018-07-04 MED ORDER — BUPIVACAINE HCL (PF) 0.5 % IJ SOLN
INTRAMUSCULAR | Status: AC
  Filled 2018-07-04: qty 30

## 2018-07-04 MED ORDER — TRAMADOL HCL 50 MG PO TABS: 50.0000 mg | ORAL_TABLET | Freq: Four times a day (QID) | ORAL | 0 refills | Status: DC | PRN

## 2018-07-04 MED ORDER — ONDANSETRON HCL 4 MG/2ML IJ SOLN
INTRAMUSCULAR | Status: DC | PRN
  Administered 2018-07-04: 4 mg via INTRAVENOUS

## 2018-07-04 MED ORDER — SUGAMMADEX SODIUM 200 MG/2ML IV SOLN
INTRAVENOUS | Status: DC | PRN
  Administered 2018-07-04: 220 mg via INTRAVENOUS

## 2018-07-04 MED ORDER — ONDANSETRON HCL 4 MG/2ML IJ SOLN
INTRAMUSCULAR | Status: AC
  Filled 2018-07-04: qty 2

## 2018-07-04 MED ORDER — FENTANYL CITRATE (PF) 100 MCG/2ML IJ SOLN: 25.0000 ug | INTRAMUSCULAR | Status: DC | PRN

## 2018-07-04 MED ORDER — FENTANYL CITRATE (PF) 100 MCG/2ML IJ SOLN
INTRAMUSCULAR | Status: DC | PRN
  Administered 2018-07-04 (×2): 50 ug via INTRAVENOUS

## 2018-07-04 MED ORDER — ONDANSETRON HCL 4 MG/2ML IJ SOLN: 4.0000 mg | Freq: Once | INTRAMUSCULAR | Status: DC | PRN

## 2018-07-04 MED ORDER — CEFAZOLIN SODIUM-DEXTROSE 2-4 GM/100ML-% IV SOLN
2.0000 g | INTRAVENOUS | Status: AC
  Administered 2018-07-04: 2 g via INTRAVENOUS
  Filled 2018-07-04: qty 100

## 2018-07-04 MED ORDER — PROPOFOL 10 MG/ML IV BOLUS
INTRAVENOUS | Status: AC
  Filled 2018-07-04: qty 20

## 2018-07-04 MED ORDER — MIDAZOLAM HCL 2 MG/2ML IJ SOLN
INTRAMUSCULAR | Status: AC
  Filled 2018-07-04: qty 2

## 2018-07-04 MED ORDER — MIDAZOLAM HCL 2 MG/2ML IJ SOLN
INTRAMUSCULAR | Status: DC | PRN
  Administered 2018-07-04: 2 mg via INTRAVENOUS

## 2018-07-04 SURGICAL SUPPLY — 34 items
BLADE SURG 15 STRL LF DISP TIS (BLADE) ×1 IMPLANT
BLADE SURG 15 STRL SS (BLADE) ×2
CHLORAPREP W/TINT 26 (MISCELLANEOUS) ×3 IMPLANT
CLOSURE WOUND 1/2 X4 (GAUZE/BANDAGES/DRESSINGS) ×1
COVER SURGICAL LIGHT HANDLE (MISCELLANEOUS) ×3 IMPLANT
COVER WAND RF STERILE (DRAPES) ×3 IMPLANT
DECANTER SPIKE VIAL GLASS SM (MISCELLANEOUS) ×3 IMPLANT
DERMABOND ADVANCED (GAUZE/BANDAGES/DRESSINGS) ×2
DERMABOND ADVANCED .7 DNX12 (GAUZE/BANDAGES/DRESSINGS) ×1 IMPLANT
DRAIN PENROSE 18X1/2 LTX STRL (DRAIN) ×3 IMPLANT
DRAPE LAPAROTOMY TRNSV 102X78 (DRAPE) ×3 IMPLANT
ELECT PENCIL ROCKER SW 15FT (MISCELLANEOUS) ×3 IMPLANT
ELECT REM PT RETURN 15FT ADLT (MISCELLANEOUS) ×3 IMPLANT
GLOVE BIOGEL PI IND STRL 7.0 (GLOVE) ×1 IMPLANT
GLOVE BIOGEL PI INDICATOR 7.0 (GLOVE) ×2
GLOVE SURG ORTHO 8.0 STRL STRW (GLOVE) ×3 IMPLANT
GOWN STRL REUS W/TWL XL LVL3 (GOWN DISPOSABLE) ×9 IMPLANT
KIT BASIN OR (CUSTOM PROCEDURE TRAY) ×3 IMPLANT
KIT TURNOVER KIT A (KITS) ×3 IMPLANT
MESH ULTRAPRO 3X6 7.6X15CM (Mesh General) ×3 IMPLANT
NEEDLE HYPO 25X1 1.5 SAFETY (NEEDLE) ×3 IMPLANT
NS IRRIG 1000ML POUR BTL (IV SOLUTION) ×3 IMPLANT
PACK BASIC VI WITH GOWN DISP (CUSTOM PROCEDURE TRAY) ×3 IMPLANT
SPONGE LAP 4X18 RFD (DISPOSABLE) ×3 IMPLANT
STRIP CLOSURE SKIN 1/2X4 (GAUZE/BANDAGES/DRESSINGS) ×2 IMPLANT
SUT MNCRL AB 4-0 PS2 18 (SUTURE) ×3 IMPLANT
SUT NOVA NAB GS-21 0 18 T12 DT (SUTURE) ×3 IMPLANT
SUT NOVA NAB GS-22 2 0 T19 (SUTURE) ×6 IMPLANT
SUT SILK 2 0 SH (SUTURE) ×3 IMPLANT
SUT VIC AB 3-0 SH 18 (SUTURE) ×3 IMPLANT
SYR BULB IRRIGATION 50ML (SYRINGE) ×3 IMPLANT
SYR CONTROL 10ML LL (SYRINGE) ×3 IMPLANT
TOWEL OR 17X26 10 PK STRL BLUE (TOWEL DISPOSABLE) ×3 IMPLANT
YANKAUER SUCT BULB TIP 10FT TU (MISCELLANEOUS) ×3 IMPLANT

## 2018-07-04 NOTE — Op Note (Signed)
Inguinal Hernia, Open, Procedure Note  Pre-operative Diagnosis:  Right inguinal hernia, reducible  Post-operative Diagnosis: same  Procedure:  Open repair right inguinal hernia with mesh  Surgeon:  Earnstine Regal, MD, FACS  Anesthesia:  General  Preparation:  Chlora-prep  Estimated Blood Loss: minimal  Complications:  none  Indications: The patient presented with a right, reducible hernia.    Procedure Details  The patient was evaluated in the holding area. All of the patient's questions were answered and the proposed procedure was confirmed. The site of the procedure was properly marked. The patient was taken to the Operating Room, identified by name, and the procedure verified as inguinal hernia repair.  The patient was placed in the supine position and underwent induction of anesthesia. A "Time Out" was performed per routine. The lower abdomen and groin were prepped and draped in the usual aseptic fashion.  After ascertaining that an adequate level of anesthesia had been obtained, an incision was made in the groin with a #10 blade.  Dissection was carried through the subcutaneous tissues and hemostasis obtained with the electrocautery.  A Gelpi retractor was placed for exposure.  The external oblique fascia was incised in line with it's fibers and extended through the external inguinal ring.  The cord structures were dissected out of the inguinal canal and encircled with a Penrose drain.  The floor of the inguinal canal was dissected out.  There was laxity of the floor without a discrete fascial defect.  The cord was explored and there was a large "lipoma" of the cord with prolapse pre-peritoneal adipose tissue but no discrete hernia sac.  The adipose tissue was excised and also reduced back through the internal inguinal ring.  The ring was closed laterally with interrupted 0-Novofil simple sutures.  The floor of the inguinal canal was reconstructed with Ethicon Ultrapro mesh cut to the  appropriate dimensions.  It was secured to the pubic tubercle with a 2-0 Novafil suture and along the inguinal ligament with a running 2-0 Novafil suture.  Mesh was split to accommodate the cord structures.  The superior margin of the mesh was secured to the transversalis and internal oblique musculature with interrupted 2-0 Novafil sutures.  The tails of the mesh were overlapped lateral to the cord structures and secured to the inguinal ligament with interrupted 2-0 Novafil sutures to recreate the internal inguinal ring.  Cord structures were returned to the inguinal canal.  Local anesthetic was infiltrated throughout the field.  External oblique fascia was closed with interrupted 3-0 Vicryl sutures.  Subcutaneous tissues were closed with interrupted 3-0 Vicryl sutures.  Skin was anesthetized with local anesthetic, and the skin edges were re-approximated with a running 4-0 Monocryl suture.  Wound was washed and dried and Dermabond was applied.  Instrument, sponge, and needle counts were correct prior to closure and at the conclusion of the case.  The patient tolerated the procedure well.  The patient was awakened from anesthesia and brought to the recovery room in stable condition.  Armandina Gemma, MD [facility] Office: [phone]

## 2018-07-04 NOTE — Anesthesia Preprocedure Evaluation (Addendum)
Anesthesia Evaluation  Patient identified by MRN, date of birth, ID band Patient awake    Reviewed: Allergy & Precautions, NPO status , Patient's Chart, lab work & pertinent test results  Airway Mallampati: II  TM Distance: >3 FB     Dental  (+) Dental Advisory Given   Pulmonary former smoker,    breath sounds clear to auscultation       Cardiovascular negative cardio ROS   Rhythm:Regular Rate:Normal     Neuro/Psych negative neurological ROS     GI/Hepatic negative GI ROS, Neg liver ROS,   Endo/Other  negative endocrine ROS  Renal/GU negative Renal ROS     Musculoskeletal  (+) Arthritis ,   Abdominal   Peds  Hematology negative hematology ROS (+)   Anesthesia Other Findings   Reproductive/Obstetrics                             Lab Results  Component Value Date   WBC 6.8 07/01/2018   HGB 15.4 07/01/2018   HCT 46.0 07/01/2018   MCV 90.2 07/01/2018   PLT 231 07/01/2018   Lab Results  Component Value Date   CREATININE 0.76 07/05/2015   BUN 9 07/05/2015   NA 135 07/05/2015   K 3.8 07/05/2015   CL 103 07/05/2015   CO2 24 07/05/2015    Anesthesia Physical Anesthesia Plan  ASA: II  Anesthesia Plan: General   Post-op Pain Management:    Induction: Intravenous  PONV Risk Score and Plan: 2 and Dexamethasone, Ondansetron and Treatment may vary due to age or medical condition  Airway Management Planned: Oral ETT  Additional Equipment: None  Intra-op Plan:   Post-operative Plan: Extubation in OR  Informed Consent: I have reviewed the patients History and Physical, chart, labs and discussed the procedure including the risks, benefits and alternatives for the proposed anesthesia with the patient or authorized representative who has indicated his/her understanding and acceptance.     Dental advisory given  Plan Discussed with: CRNA  Anesthesia Plan Comments:          Anesthesia Quick Evaluation

## 2018-07-04 NOTE — Anesthesia Procedure Notes (Addendum)
Procedure Name: Intubation Date/Time: 07/04/2018 9:07 AM Performed by: Sharlette Dense, CRNA Patient Re-evaluated:Patient Re-evaluated prior to induction Oxygen Delivery Method: Circle system utilized Preoxygenation: Pre-oxygenation with 100% oxygen Induction Type: IV induction and Rapid sequence Laryngoscope Size: Miller and 3 Grade View: Grade I Tube type: Oral Tube size: 8.0 mm Number of attempts: 1 Airway Equipment and Method: Stylet Placement Confirmation: ETT inserted through vocal cords under direct vision,  positive ETCO2 and breath sounds checked- equal and bilateral Secured at: 23 cm Tube secured with: Tape Dental Injury: Teeth and Oropharynx as per pre-operative assessment

## 2018-07-04 NOTE — Progress Notes (Signed)
AssistedDr. Rodman Comp with right, ultrasound guided  block. Side rails up, monitors on throughout procedure. See vital signs in flow sheet. Tolerated Procedure well.

## 2018-07-04 NOTE — Transfer of Care (Signed)
Immediate Anesthesia Transfer of Care Note  Patient: Douglas Holmes  Procedure(s) Performed: OPEN RIGHT INGUINAL HERNIA REPAIR WITH MESH (Right Inguinal)  Patient Location: PACU  Anesthesia Type:GA combined with regional for post-op pain  Level of Consciousness: drowsy  Airway & Oxygen Therapy: Patient Spontanous Breathing and Patient connected to face mask oxygen  Post-op Assessment: Report given to RN and Post -op Vital signs reviewed and stable  Post vital signs: Reviewed and stable  Last Vitals:  Vitals Value Taken Time  BP 120/80 07/04/18 1028  Temp    Pulse 77 07/04/18 1029  Resp 14 07/04/18 1029  SpO2 99 % 07/04/18 1029  Vitals shown include unvalidated device data.  Last Pain:  Vitals:   07/04/18 0855  TempSrc:   PainSc: 0-No pain      Patients Stated Pain Goal: 3 (47/18/55 0158)  Complications: No apparent anesthesia complications

## 2018-07-04 NOTE — Interval H&P Note (Signed)
History and Physical Interval Note:  07/04/2018 8:55 AM  Douglas Holmes  has presented today for surgery, with the diagnosis of RIGHT INGUINAL HERNIA.  The various methods of treatment have been discussed with the patient and family. After consideration of risks, benefits and other options for treatment, the patient has consented to    Procedure(s): OPEN RIGHT INGUINAL HERNIA REPAIR WITH MESH (Right) as a surgical intervention.    The patient's history has been reviewed, patient examined, no change in status, stable for surgery.  I have reviewed the patient's chart and labs.  Questions were answered to the patient's satisfaction.    Armandina Gemma, [place] [facility] Office: [place]

## 2018-07-04 NOTE — Anesthesia Procedure Notes (Signed)
Anesthesia Regional Block: TAP block   Pre-Anesthetic Checklist: ,, timeout performed, Correct Patient, Correct Site, Correct Laterality, Correct Procedure, Correct Position, site marked, Risks and benefits discussed,  Surgical consent,  Pre-op evaluation,  At surgeon's request and post-op pain management  Laterality: Right  Prep: chloraprep       Needles:  Injection technique: Single-shot  Needle Type: Echogenic Needle     Needle Length: 9cm  Needle Gauge: 21     Additional Needles:   Procedures:,,,, ultrasound used (permanent image in chart),,,,  Narrative:  Start time: 07/04/2018 8:30 AM End time: 07/04/2018 8:38 AM Injection made incrementally with aspirations every 5 mL.  Performed by: Personally  Anesthesiologist: Suzette Battiest, MD

## 2018-07-04 NOTE — Anesthesia Postprocedure Evaluation (Signed)
Anesthesia Post Note  Patient: Douglas Holmes  Procedure(s) Performed: OPEN RIGHT INGUINAL HERNIA REPAIR WITH MESH (Right Inguinal)     Patient location during evaluation: PACU Anesthesia Type: General Level of consciousness: awake and alert Pain management: pain level controlled Vital Signs Assessment: post-procedure vital signs reviewed and stable Respiratory status: spontaneous breathing, nonlabored ventilation, respiratory function stable and patient connected to nasal cannula oxygen Cardiovascular status: blood pressure returned to baseline and stable Postop Assessment: no apparent nausea or vomiting Anesthetic complications: no    Last Vitals:  Vitals:   07/04/18 1045 07/04/18 1102  BP: 122/79 112/77  Pulse: 71 62  Resp: 11 15  Temp:  36.6 C  SpO2: 93% 97%    Last Pain:  Vitals:   07/04/18 1102  TempSrc:   PainSc: 0-No pain                 Tiajuana Amass

## 2018-07-04 NOTE — Progress Notes (Signed)
Dr Harlow Asa notified pt family did not receive call from him.  Dr Harlow Asa to call pt family within the hour.

## 2018-07-07 ENCOUNTER — Encounter (HOSPITAL_COMMUNITY): Payer: Self-pay | Admitting: Surgery

## 2018-07-24 DIAGNOSIS — H2513 Age-related nuclear cataract, bilateral: Secondary | ICD-10-CM | POA: Diagnosis not present

## 2018-07-24 DIAGNOSIS — H5203 Hypermetropia, bilateral: Secondary | ICD-10-CM | POA: Diagnosis not present

## 2018-08-15 ENCOUNTER — Other Ambulatory Visit: Payer: Self-pay

## 2018-09-25 DIAGNOSIS — Z23 Encounter for immunization: Secondary | ICD-10-CM | POA: Diagnosis not present

## 2018-10-31 DIAGNOSIS — Z125 Encounter for screening for malignant neoplasm of prostate: Secondary | ICD-10-CM | POA: Diagnosis not present

## 2018-10-31 DIAGNOSIS — E7800 Pure hypercholesterolemia, unspecified: Secondary | ICD-10-CM | POA: Diagnosis not present

## 2018-11-05 DIAGNOSIS — Z0001 Encounter for general adult medical examination with abnormal findings: Secondary | ICD-10-CM | POA: Diagnosis not present

## 2018-11-05 DIAGNOSIS — D229 Melanocytic nevi, unspecified: Secondary | ICD-10-CM | POA: Diagnosis not present

## 2018-11-05 DIAGNOSIS — E7800 Pure hypercholesterolemia, unspecified: Secondary | ICD-10-CM | POA: Diagnosis not present

## 2018-11-05 DIAGNOSIS — I2510 Atherosclerotic heart disease of native coronary artery without angina pectoris: Secondary | ICD-10-CM | POA: Diagnosis not present

## 2018-11-05 DIAGNOSIS — Z9049 Acquired absence of other specified parts of digestive tract: Secondary | ICD-10-CM | POA: Diagnosis not present

## 2018-11-05 DIAGNOSIS — Z8551 Personal history of malignant neoplasm of bladder: Secondary | ICD-10-CM | POA: Diagnosis not present

## 2018-11-05 DIAGNOSIS — M159 Polyosteoarthritis, unspecified: Secondary | ICD-10-CM | POA: Diagnosis not present

## 2018-11-18 DIAGNOSIS — N400 Enlarged prostate without lower urinary tract symptoms: Secondary | ICD-10-CM | POA: Diagnosis not present

## 2018-11-18 DIAGNOSIS — Z8551 Personal history of malignant neoplasm of bladder: Secondary | ICD-10-CM | POA: Diagnosis not present

## 2018-12-03 DIAGNOSIS — B351 Tinea unguium: Secondary | ICD-10-CM | POA: Diagnosis not present

## 2018-12-03 DIAGNOSIS — L03039 Cellulitis of unspecified toe: Secondary | ICD-10-CM | POA: Diagnosis not present

## 2018-12-03 DIAGNOSIS — T304 Corrosion of unspecified body region, unspecified degree: Secondary | ICD-10-CM | POA: Diagnosis not present

## 2018-12-05 DIAGNOSIS — D2262 Melanocytic nevi of left upper limb, including shoulder: Secondary | ICD-10-CM | POA: Diagnosis not present

## 2018-12-05 DIAGNOSIS — D2261 Melanocytic nevi of right upper limb, including shoulder: Secondary | ICD-10-CM | POA: Diagnosis not present

## 2018-12-05 DIAGNOSIS — L821 Other seborrheic keratosis: Secondary | ICD-10-CM | POA: Diagnosis not present

## 2018-12-05 DIAGNOSIS — L308 Other specified dermatitis: Secondary | ICD-10-CM | POA: Diagnosis not present

## 2018-12-05 DIAGNOSIS — D225 Melanocytic nevi of trunk: Secondary | ICD-10-CM | POA: Diagnosis not present

## 2018-12-05 DIAGNOSIS — D1801 Hemangioma of skin and subcutaneous tissue: Secondary | ICD-10-CM | POA: Diagnosis not present

## 2018-12-18 ENCOUNTER — Ambulatory Visit: Payer: No Typology Code available for payment source | Admitting: Podiatry

## 2018-12-18 ENCOUNTER — Ambulatory Visit (INDEPENDENT_AMBULATORY_CARE_PROVIDER_SITE_OTHER): Payer: Medicare Other | Admitting: Podiatry

## 2018-12-18 ENCOUNTER — Other Ambulatory Visit: Payer: Self-pay

## 2018-12-18 DIAGNOSIS — L603 Nail dystrophy: Secondary | ICD-10-CM

## 2018-12-20 NOTE — Progress Notes (Signed)
Subjective:  Patient ID: Douglas Holmes, male    DOB: 1945-07-04,  MRN: [id number] HPI Chief Complaint  Patient presents with  . Nail Problem    BL hallux toenails thick and discolored x 5 yrs; no pain/injury to toenailes Tx: tea tree oil -pt states he had a skin irriation /burn due to the tea tree oil couple wks ago, he was seen by PCP and wasd Rx abx, anit-fungi tx and a salve  -it's doing better     73 y.o. male presents with the above complaint.   ROS: Denies fever chills nausea vomiting muscle aches pains calf pain back pain chest pain shortness of breath.  Past Medical History:  Diagnosis Date  . Bladder tumor   . Bowel obstruction ([place])    x1 2007, x1 2017  . BPH (benign prostatic hypertrophy)   . Cancer [facility]    bladder May 2014, Bladder 2015  . Diverticulosis of colon   . ED (erectile dysfunction)   . Focal dystonia NEUROLOGIST-  DR Star Age   RIGHT HAND AND ARM  . Generalized OA    FINGERS  . History of bladder cancer urologist--  dr Karsten Ro   05/ 2014  single papillary (Ta G1)  . History of diverticulitis   . History of kidney stones   . Hx SBO   . Hyperlipidemia   . Inguinal hernia   . Wears hearing aid    BILATERAL   Past Surgical History:  Procedure Laterality Date  . EXPLORATORY LAPAROTOMY W/ BOWEL RESECTION  11/ 2007   SBO  . GREEN LIGHT LASER TURP (TRANSURETHRAL RESECTION OF PROSTATE  2004  . HAND SURGERY  2016   sutures where pt fell and cut hand  . INGUINAL HERNIA REPAIR Right 07/04/2018   Procedure: OPEN RIGHT INGUINAL HERNIA REPAIR WITH MESH;  Surgeon: Armandina Gemma, MD;  Location: [facility];  Service: General;  Laterality: Right;  . LEFT URETEROSCOPIC STONE EXTRACTION  3/ 2013  . PARTIAL COLECTOMY  1988   diverticulitis  . TRANSURETHRAL RESECTION OF BLADDER TUMOR N/A 11/20/2013   Procedure: TRANSURETHRAL RESECTION OF BLADDER TUMOR (TURBT);  Surgeon: Claybon Jabs, MD;  Location: [facility];  Service: Urology;  Laterality: N/A;   . TRANSURETHRAL RESECTION OF PROSTATE  5/ 2014   and Resection Bladder Tumor  . VASECTOMY  1999    Current Outpatient Medications:  Javier Docker Oil 500 MG CAPS, Take 500 mg by mouth at bedtime., Disp: , Rfl:  .  Multiple Vitamin (MULTIVITAMIN WITH MINERALS) TABS tablet, Take 1 tablet by mouth at bedtime., Disp: , Rfl:  .  rosuvastatin (CRESTOR) 20 MG tablet, Take 20 mg by mouth at bedtime., Disp: , Rfl:  .  aspirin EC 81 MG tablet, Take 81 mg by mouth at bedtime., Disp: , Rfl:  .  Misc Natural Products (GLUCOSAMINE CHOND MSM FORMULA PO), Take 1 tablet by mouth at bedtime. [id number], Disp: , Rfl:   Current Facility-Administered Medications:  .  0.9 %  sodium chloride infusion, 500 mL, Intravenous, Continuous, Danis, Estill Cotta III, MD  No Known Allergies Review of Systems Objective:  There were no vitals filed for this visit.  General: Well developed, nourished, in no acute distress, alert and oriented x3   Dermatological: Skin is warm, dry and supple bilateral. Nails x 10 are well maintained; remaining integument appears unremarkable at this time. There are no open sores, no preulcerative lesions, no rash or signs of infection present.  Toenails are  thick yellow with what appears to be some subungual debris however not necessarily mycotic but just dystrophic.  He has a rash that appears to be clearing most likely from a topical dermatitis associated with an oil that he has been putting on his toenails.   Vascular: Dorsalis Pedis artery and Posterior Tibial artery pedal pulses are 2/4 bilateral with immedate capillary fill time. Pedal hair growth present. No varicosities and no lower extremity edema present bilateral.   Neruologic: Grossly intact via light touch bilateral. Vibratory intact via tuning fork bilateral. Protective threshold with Semmes Wienstein monofilament intact to all pedal sites bilateral. Patellar and Achilles deep tendon reflexes 2+ bilateral. No Babinski or clonus noted  bilateral.   Musculoskeletal: No gross boney pedal deformities bilateral. No pain, crepitus, or limitation noted with foot and ankle range of motion bilateral. Muscular strength 5/5 in all groups tested bilateral.  Gait: Unassisted, Nonantalgic.    Radiographs:  None taken  Assessment & Plan:   Assessment: Nail dystrophy  Plan: Samples of the toenails were taken today for pathologic evaluation follow-up with him in 1 month     Max T. [place], [place]

## 2019-01-20 ENCOUNTER — Encounter: Payer: Self-pay | Admitting: Podiatry

## 2019-01-20 ENCOUNTER — Other Ambulatory Visit: Payer: Self-pay

## 2019-01-20 ENCOUNTER — Ambulatory Visit (INDEPENDENT_AMBULATORY_CARE_PROVIDER_SITE_OTHER): Payer: 59 | Admitting: Podiatry

## 2019-01-20 DIAGNOSIS — L603 Nail dystrophy: Secondary | ICD-10-CM | POA: Diagnosis not present

## 2019-01-20 MED ORDER — TERBINAFINE HCL 250 MG PO TABS: 250.0000 mg | ORAL_TABLET | Freq: Every day | ORAL | 0 refills | Status: DC

## 2019-01-20 NOTE — Patient Instructions (Signed)
Terbinafine tablets What is this medicine? TERBINAFINE (TER bin a feen) is an antifungal medicine. It is used to treat certain kinds of fungal or yeast infections. This medicine may be used for other purposes; ask your health care provider or pharmacist if you have questions. COMMON BRAND NAME(S): Lamisil, Terbinex What should I tell my health care provider before I take this medicine? They need to know if you have any of these conditions:  drink alcoholic beverages  kidney disease  liver disease  an unusual or allergic reaction to terbinafine, other medicines, foods, dyes, or preservatives  pregnant or trying to get pregnant  breast-feeding How should I use this medicine? Take this medicine by mouth with a full glass of water. Follow the directions on the prescription label. You can take this medicine with food or on an empty stomach. Take your medicine at regular intervals. Do not take your medicine more often than directed. Do not skip doses or stop your medicine early even if you feel better. Do not stop taking except on your doctor's advice. Talk to your pediatrician regarding the use of this medicine in children. Special care may be needed. Overdosage: If you think you have taken too much of this medicine contact a poison control center or emergency room at once. NOTE: This medicine is only for you. Do not share this medicine with others. What if I miss a dose? If you miss a dose, take it as soon as you can. If it is almost time for your next dose, take only that dose. Do not take double or extra doses. What may interact with this medicine? Do not take this medicine with any of the following medications:  thioridazine This medicine may also interact with the following medications:  beta-blockers  caffeine  cimetidine  cyclosporine  medicines for depression, anxiety, or psychotic disturbances  medicines for fungal infections like fluconazole and ketoconazole  medicines  for irregular heartbeat like amiodarone, flecainide and propafenone  rifampin  warfarin This list may not describe all possible interactions. Give your health care provider a list of all the medicines, herbs, non-prescription drugs, or dietary supplements you use. Also tell them if you smoke, drink alcohol, or use illegal drugs. Some items may interact with your medicine. What should I watch for while using this medicine? Visit your doctor or health care provider regularly. Tell your doctor right away if you have nausea or vomiting, loss of appetite, stomach pain on your right upper side, yellow skin, dark urine, light stools, or are over tired. Some fungal infections need many weeks or months of treatment to cure. If you are taking this medicine for a long time, you will need to have important blood work done. This medicine may cause serious skin reactions. They can happen weeks to months after starting the medicine. Contact your health care provider right away if you notice fevers or flu-like symptoms with a rash. The rash may be red or purple and then turn into blisters or peeling of the skin. Or, you might notice a red rash with swelling of the face, lips or lymph nodes in your neck or under your arms. What side effects may I notice from receiving this medicine? Side effects that you should report to your doctor or health care professional as soon as possible:  allergic reactions like skin rash or hives, swelling of the face, lips, or tongue  changes in vision  dark urine  fever or infection  general ill feeling or flu-like symptoms    light-colored stools  loss of appetite, nausea  rash, fever, and swollen lymph nodes  redness, blistering, peeling or loosening of the skin, including inside the mouth  right upper belly pain  unusually weak or tired  yellowing of the eyes or skin Side effects that usually do not require medical attention (report to your doctor or health care  professional if they continue or are bothersome):  changes in taste  diarrhea  hair loss  muscle or joint pain  stomach gas  stomach upset This list may not describe all possible side effects. Call your doctor for medical advice about side effects. You may report side effects to FDA at 1-800-FDA-1088. Where should I keep my medicine? Keep out of the reach of children. Store at room temperature below 25 degrees C (77 degrees F). Protect from light. Throw away any unused medicine after the expiration date. NOTE: This sheet is a summary. It may not cover all possible information. If you have questions about this medicine, talk to your doctor, pharmacist, or health care provider.  2020 Elsevier/Gold Standard (2018-04-11 15:37:07)  

## 2019-01-21 NOTE — Progress Notes (Signed)
Presents today for follow-up of his nail pathology.  States is been no change.  Objective: Vital signs are stable alert and oriented x3.  Pathology results do demonstrate T rubrum onychomycosis.  Assessment: Onychomycosis.  Plan: We discussed different treatment options including topicals oral and laser therapy.  At this point he would like to consider oral therapy he is recently had blood work performed by Dr. Shelia Media and will bring that to me stating that it was perfect and was a whole battery of blood work.  So we will go ahead and get him started on 30 days of Lamisil to be followed by another 90 days of Lamisil.  He will get that blood work test within the next couple of days.  We will perform blood work again next visit.  We did discuss pros and cons of this medication possible side effects and complications he understands this is amenable to it and will follow up with me with any questions or concerns.

## 2019-01-28 DIAGNOSIS — R69 Illness, unspecified: Secondary | ICD-10-CM | POA: Diagnosis not present

## 2019-02-10 ENCOUNTER — Telehealth: Payer: Self-pay | Admitting: Podiatry

## 2019-02-10 NOTE — Telephone Encounter (Signed)
Pt  Called to say that he will need a refill on his lamisil before his appt on 02/24/19 w/[facility]

## 2019-02-10 NOTE — Telephone Encounter (Signed)
Left message informing pt the medication would stay in his system and Dr. Milinda Pointer would reevaluate and if progressing well, prescribe the remaining medication.

## 2019-02-24 ENCOUNTER — Other Ambulatory Visit: Payer: Self-pay

## 2019-02-24 ENCOUNTER — Encounter: Payer: Self-pay | Admitting: Podiatry

## 2019-02-24 ENCOUNTER — Ambulatory Visit (INDEPENDENT_AMBULATORY_CARE_PROVIDER_SITE_OTHER): Payer: 59 | Admitting: Podiatry

## 2019-02-24 DIAGNOSIS — Z79899 Other long term (current) drug therapy: Secondary | ICD-10-CM

## 2019-02-24 DIAGNOSIS — L603 Nail dystrophy: Secondary | ICD-10-CM | POA: Diagnosis not present

## 2019-02-24 LAB — HEPATIC FUNCTION PANEL
AG Ratio: 1.5 (calc) (ref 1.0–2.5)
ALT: 17 U/L (ref 9–46)
AST: 18 U/L (ref 10–35)
Albumin: 4.1 g/dL (ref 3.6–5.1)
Alkaline phosphatase (APISO): 46 U/L (ref 35–144)
Bilirubin, Direct: 0.1 mg/dL (ref 0.0–0.2)
Globulin: 2.8 g/dL (calc) (ref 1.9–3.7)
Indirect Bilirubin: 0.4 mg/dL (calc) (ref 0.2–1.2)
Total Bilirubin: 0.5 mg/dL (ref 0.2–1.2)
Total Protein: 6.9 g/dL (ref 6.1–8.1)

## 2019-02-24 MED ORDER — TERBINAFINE HCL 250 MG PO TABS: 250.0000 mg | ORAL_TABLET | Freq: Every day | ORAL | 0 refills | Status: DC

## 2019-02-24 NOTE — Progress Notes (Signed)
He presents today for follow-up of his Lamisil therapy.  He denies fever chills nausea vomiting muscle aches pains calf pain back pain chest pain shortness of breath or rashes.  Continues to take the pill 1 tablet daily finished his last dose just the other day.  Objective: Vital signs are stable alert and oriented x3.  Pulses are palpable.  No change in physical exam.  Assessment: Long-term therapy with Lamisil secondary to confirmed onychomycosis.  Plan: Requesting a liver profile be drawn today and went ahead and prescribed the other 90 pills.  Follow-up with him in 4 months.

## 2019-03-10 ENCOUNTER — Telehealth: Payer: Self-pay | Admitting: *Deleted

## 2019-03-10 NOTE — Telephone Encounter (Signed)
Left message informing pt of Dr. Hyatt's review of results and orders. 

## 2019-03-10 NOTE — Telephone Encounter (Signed)
-----   Message from Garrel Ridgel, [place] sent at 02/25/2019  6:44 AM EST ----- Blood work looks perfect.

## 2019-04-23 DIAGNOSIS — R1032 Left lower quadrant pain: Secondary | ICD-10-CM | POA: Diagnosis not present

## 2019-04-28 DIAGNOSIS — R69 Illness, unspecified: Secondary | ICD-10-CM | POA: Diagnosis not present

## 2019-05-19 ENCOUNTER — Other Ambulatory Visit: Payer: Self-pay | Admitting: Podiatry

## 2019-05-25 ENCOUNTER — Telehealth: Payer: Self-pay | Admitting: Podiatry

## 2019-05-25 NOTE — Telephone Encounter (Signed)
Left message informing pt he had received 90 therapeutic doses of the terbinafine and Dr. Milinda Pointer would evaluate for healthy outgrowth at the 4 month check up.

## 2019-05-25 NOTE — Telephone Encounter (Signed)
Pt called for refill to the terbinafine and wanted to know why it was denied because he thought he was to be on it for a while.

## 2019-05-25 NOTE — Telephone Encounter (Signed)
Pt called to say that his medication  Refill had been denied and pt wanted to know why and what he needed to do or if Dr, does not want to refill lamisil

## 2019-06-25 ENCOUNTER — Other Ambulatory Visit: Payer: Self-pay

## 2019-06-25 ENCOUNTER — Encounter: Payer: Self-pay | Admitting: Podiatry

## 2019-06-25 ENCOUNTER — Ambulatory Visit (INDEPENDENT_AMBULATORY_CARE_PROVIDER_SITE_OTHER): Payer: Medicare HMO | Admitting: Podiatry

## 2019-06-25 DIAGNOSIS — L603 Nail dystrophy: Secondary | ICD-10-CM

## 2019-06-25 MED ORDER — TERBINAFINE HCL 250 MG PO TABS: 250.0000 mg | ORAL_TABLET | Freq: Every day | ORAL | 0 refills | Status: DC

## 2019-06-25 NOTE — Progress Notes (Signed)
He presents today for follow-up of his Lamisil therapy.  He states that I really have not noticed a lot of difference but have definitely not had any problems with the medication other than his taste.  He states that it tastes horrible.  He denies fever chills nausea vomiting muscle aches pains calf pain back pain chest pain shortness of breath rash itching.  Objective: Vital signs are stable he is alert oriented x3.  Hallux nails bilaterally demonstrate a definitive line of growth approximately one third of the way from the nail bed.  It appears to be clearing very nicely.  Assessment: Long-term therapy with Lamisil for onychomycosis.  Plan: I started him on Lamisil 1 tablet every other day and he will take that for 60 days and I will follow-up with him in 3 months

## 2019-07-08 ENCOUNTER — Other Ambulatory Visit: Payer: Self-pay | Admitting: Podiatry

## 2019-07-16 ENCOUNTER — Telehealth: Payer: Self-pay | Admitting: Podiatry

## 2019-07-16 NOTE — Telephone Encounter (Signed)
Left message informing pt that often after insurance has covered their idea of the recommended amount they would not cover anymore, but he could personal pay and generally the medication was under $20.00.

## 2019-07-16 NOTE — Telephone Encounter (Signed)
Pt called stating he has been unable to get his prescription from 06/25/19 filled due to complication between his pharmacy and insurance company. Pt calling to follow up and see what needs to be done for him to be able to pick up his prescription for Lamisil.  Please give patient a call.

## 2019-07-23 DIAGNOSIS — H903 Sensorineural hearing loss, bilateral: Secondary | ICD-10-CM | POA: Diagnosis not present

## 2019-07-29 DIAGNOSIS — R69 Illness, unspecified: Secondary | ICD-10-CM | POA: Diagnosis not present

## 2019-10-01 ENCOUNTER — Ambulatory Visit (INDEPENDENT_AMBULATORY_CARE_PROVIDER_SITE_OTHER): Payer: Medicare HMO | Admitting: Podiatry

## 2019-10-01 ENCOUNTER — Encounter: Payer: Self-pay | Admitting: Podiatry

## 2019-10-01 ENCOUNTER — Other Ambulatory Visit: Payer: Self-pay

## 2019-10-01 DIAGNOSIS — L603 Nail dystrophy: Secondary | ICD-10-CM | POA: Diagnosis not present

## 2019-10-01 MED ORDER — TERBINAFINE HCL 250 MG PO TABS: 250.0000 mg | ORAL_TABLET | Freq: Every day | ORAL | 0 refills | Status: DC

## 2019-10-01 NOTE — Progress Notes (Signed)
He presents today for a follow-up of his nail fungus.  States that he seems to be doing very well he has completed his first dose of an every other day regimen and denies any fever chills nausea vomit muscle aches and pains associated with it.  Objective: Vital signs are stable alert oriented x3 nails are growing out probably 80 to 85% at this point.  And he is happy with the outcome.  Nail plates appear to be healing very nicely.  Assessment: Well-healing nail plates long-term onychomycosis Lamisil.  Plan: At this point I am going to continue him 1 tablet every other day with Lamisil I will follow-up with him in 3 months.

## 2019-10-28 DIAGNOSIS — R69 Illness, unspecified: Secondary | ICD-10-CM | POA: Diagnosis not present

## 2019-10-29 DIAGNOSIS — R69 Illness, unspecified: Secondary | ICD-10-CM | POA: Diagnosis not present

## 2019-11-09 DIAGNOSIS — R69 Illness, unspecified: Secondary | ICD-10-CM | POA: Diagnosis not present

## 2019-11-10 DIAGNOSIS — Z0100 Encounter for examination of eyes and vision without abnormal findings: Secondary | ICD-10-CM | POA: Diagnosis not present

## 2019-11-12 DIAGNOSIS — Z125 Encounter for screening for malignant neoplasm of prostate: Secondary | ICD-10-CM | POA: Diagnosis not present

## 2019-11-19 DIAGNOSIS — Z8551 Personal history of malignant neoplasm of bladder: Secondary | ICD-10-CM | POA: Diagnosis not present

## 2019-11-19 DIAGNOSIS — N400 Enlarged prostate without lower urinary tract symptoms: Secondary | ICD-10-CM | POA: Diagnosis not present

## 2019-11-19 DIAGNOSIS — Z125 Encounter for screening for malignant neoplasm of prostate: Secondary | ICD-10-CM | POA: Diagnosis not present

## 2019-11-20 ENCOUNTER — Other Ambulatory Visit: Payer: Self-pay | Admitting: Urology

## 2019-11-23 ENCOUNTER — Other Ambulatory Visit: Payer: Self-pay | Admitting: Urology

## 2019-11-23 NOTE — Addendum Note (Signed)
Addended by: Janith Lima on: 11/23/2019 05:05 PM   Modules accepted: Orders

## 2019-12-31 ENCOUNTER — Encounter: Payer: Self-pay | Admitting: Podiatry

## 2019-12-31 ENCOUNTER — Other Ambulatory Visit: Payer: Self-pay

## 2019-12-31 ENCOUNTER — Ambulatory Visit (INDEPENDENT_AMBULATORY_CARE_PROVIDER_SITE_OTHER): Payer: Medicare HMO | Admitting: Podiatry

## 2019-12-31 DIAGNOSIS — L603 Nail dystrophy: Secondary | ICD-10-CM | POA: Diagnosis not present

## 2019-12-31 MED ORDER — TERBINAFINE HCL 250 MG PO TABS: 250.0000 mg | ORAL_TABLET | Freq: Every day | ORAL | 0 refills | Status: DC

## 2019-12-31 NOTE — Progress Notes (Signed)
He presents today for follow-up of his onychomycosis and the long-term therapy with Lamisil.  He states he is doing just great with that problem and it appears to be healing very nicely.  He goes on to say now that he has had a recurrence of his bladder cancer and that he is going to get started back on his procedures in early January for that.  Objective: Vital signs stable alert oriented x3.  Pulses are palpable.  Nails have almost grown out there were only about a quarter of an inch to be completely grown out.  Assessment: Well-healing onychomycosis secondary to Lamisil.  Plan: Continue every other day 7-day dose of Lamisil follow-up with him in about 4 months provided 30 tablets.

## 2020-01-12 DIAGNOSIS — E7800 Pure hypercholesterolemia, unspecified: Secondary | ICD-10-CM | POA: Diagnosis not present

## 2020-01-12 DIAGNOSIS — Z125 Encounter for screening for malignant neoplasm of prostate: Secondary | ICD-10-CM | POA: Diagnosis not present

## 2020-01-12 DIAGNOSIS — Z7982 Long term (current) use of aspirin: Secondary | ICD-10-CM | POA: Diagnosis not present

## 2020-01-19 ENCOUNTER — Other Ambulatory Visit: Payer: Self-pay

## 2020-01-19 ENCOUNTER — Other Ambulatory Visit (HOSPITAL_COMMUNITY)
Admission: RE | Admit: 2020-01-19 | Discharge: 2020-01-19 | Disposition: A | Discharge status: Home / Self Care | Payer: Medicare HMO | Source: Ambulatory Visit | Attending: Urology | Admitting: Urology

## 2020-01-19 ENCOUNTER — Encounter (HOSPITAL_BASED_OUTPATIENT_CLINIC_OR_DEPARTMENT_OTHER): Payer: Self-pay | Admitting: Urology

## 2020-01-19 DIAGNOSIS — Z20822 Contact with and (suspected) exposure to covid-19: Secondary | ICD-10-CM | POA: Insufficient documentation

## 2020-01-19 DIAGNOSIS — Z01818 Encounter for other preprocedural examination: Secondary | ICD-10-CM | POA: Diagnosis not present

## 2020-01-19 NOTE — Progress Notes (Signed)
Spoke w/ via phone for pre-op interview--- PT Lab needs dos----   no            Lab results------ no COVID test ------ done 01-19-2020 result in epic Arrive at ------- 1000 on 01-22-2020 NPO after MN NO Solid Food.  Clear liquids from MN until--- 0900 Medications to take morning of surgery ----- NONE Diabetic medication ----- n/a Patient Special Instructions ----- n/a Pre-Op special Istructions ----- n/a Patient verbalized understanding of instructions that were given at this phone interview. Patient denies shortness of breath, chest pain, fever, cough at this phone interview.

## 2020-01-20 LAB — SARS CORONAVIRUS 2 (TAT 6-24 HRS): SARS Coronavirus 2: NEGATIVE

## 2020-01-22 ENCOUNTER — Encounter (HOSPITAL_BASED_OUTPATIENT_CLINIC_OR_DEPARTMENT_OTHER)
Admission: RE | Disposition: A | Discharge status: Home / Self Care | Payer: Self-pay | Source: Home / Self Care | Attending: Urology

## 2020-01-22 ENCOUNTER — Ambulatory Visit (HOSPITAL_BASED_OUTPATIENT_CLINIC_OR_DEPARTMENT_OTHER)
Admission: RE | Admit: 2020-01-22 | Discharge: 2020-01-22 | Disposition: A | Discharge status: Home / Self Care | Payer: Medicare HMO | Attending: Urology | Admitting: Urology

## 2020-01-22 ENCOUNTER — Ambulatory Visit (HOSPITAL_BASED_OUTPATIENT_CLINIC_OR_DEPARTMENT_OTHER): Payer: Medicare HMO | Admitting: Anesthesiology

## 2020-01-22 ENCOUNTER — Encounter (HOSPITAL_BASED_OUTPATIENT_CLINIC_OR_DEPARTMENT_OTHER): Payer: Self-pay | Admitting: Urology

## 2020-01-22 ENCOUNTER — Other Ambulatory Visit: Payer: Self-pay

## 2020-01-22 DIAGNOSIS — N3289 Other specified disorders of bladder: Secondary | ICD-10-CM | POA: Diagnosis not present

## 2020-01-22 DIAGNOSIS — Z87891 Personal history of nicotine dependence: Secondary | ICD-10-CM | POA: Insufficient documentation

## 2020-01-22 DIAGNOSIS — K56609 Unspecified intestinal obstruction, unspecified as to partial versus complete obstruction: Secondary | ICD-10-CM | POA: Diagnosis not present

## 2020-01-22 DIAGNOSIS — K4090 Unilateral inguinal hernia, without obstruction or gangrene, not specified as recurrent: Secondary | ICD-10-CM | POA: Diagnosis not present

## 2020-01-22 DIAGNOSIS — Z7982 Long term (current) use of aspirin: Secondary | ICD-10-CM | POA: Diagnosis not present

## 2020-01-22 DIAGNOSIS — Z8249 Family history of ischemic heart disease and other diseases of the circulatory system: Secondary | ICD-10-CM | POA: Diagnosis not present

## 2020-01-22 DIAGNOSIS — N401 Enlarged prostate with lower urinary tract symptoms: Secondary | ICD-10-CM | POA: Diagnosis not present

## 2020-01-22 DIAGNOSIS — D303 Benign neoplasm of bladder: Secondary | ICD-10-CM | POA: Insufficient documentation

## 2020-01-22 DIAGNOSIS — R350 Frequency of micturition: Secondary | ICD-10-CM | POA: Insufficient documentation

## 2020-01-22 DIAGNOSIS — Z20822 Contact with and (suspected) exposure to covid-19: Secondary | ICD-10-CM | POA: Insufficient documentation

## 2020-01-22 DIAGNOSIS — Z809 Family history of malignant neoplasm, unspecified: Secondary | ICD-10-CM | POA: Diagnosis not present

## 2020-01-22 DIAGNOSIS — Z79899 Other long term (current) drug therapy: Secondary | ICD-10-CM | POA: Diagnosis not present

## 2020-01-22 DIAGNOSIS — E785 Hyperlipidemia, unspecified: Secondary | ICD-10-CM | POA: Diagnosis not present

## 2020-01-22 DIAGNOSIS — N329 Bladder disorder, unspecified: Secondary | ICD-10-CM | POA: Diagnosis present

## 2020-01-22 DIAGNOSIS — Z8551 Personal history of malignant neoplasm of bladder: Secondary | ICD-10-CM | POA: Insufficient documentation

## 2020-01-22 DIAGNOSIS — Z87442 Personal history of urinary calculi: Secondary | ICD-10-CM | POA: Insufficient documentation

## 2020-01-22 HISTORY — DX: Presence of external hearing-aid: Z97.4

## 2020-01-22 HISTORY — PX: CYSTOSCOPY WITH BIOPSY: SHX5122

## 2020-01-22 HISTORY — DX: Other specified disorders of bladder: N32.89

## 2020-01-22 HISTORY — DX: Nocturia: R35.1

## 2020-01-22 HISTORY — DX: Presence of spectacles and contact lenses: Z97.3

## 2020-01-22 LAB — RESP PANEL BY RT-PCR (FLU A&B, COVID) ARPGX2
Influenza A by PCR: NEGATIVE
Influenza B by PCR: NEGATIVE
SARS Coronavirus 2 by RT PCR: NEGATIVE

## 2020-01-22 SURGERY — CYSTOSCOPY, WITH BIOPSY
Anesthesia: General | Site: Bladder

## 2020-01-22 MED ORDER — CEFAZOLIN SODIUM-DEXTROSE 2-4 GM/100ML-% IV SOLN
INTRAVENOUS | Status: AC
  Filled 2020-01-22: qty 100

## 2020-01-22 MED ORDER — GLYCOPYRROLATE PF 0.2 MG/ML IJ SOSY
PREFILLED_SYRINGE | INTRAMUSCULAR | Status: AC
  Filled 2020-01-22: qty 2

## 2020-01-22 MED ORDER — PHENYLEPHRINE 40 MCG/ML (10ML) SYRINGE FOR IV PUSH (FOR BLOOD PRESSURE SUPPORT)
PREFILLED_SYRINGE | INTRAVENOUS | Status: AC
  Filled 2020-01-22: qty 10

## 2020-01-22 MED ORDER — LIDOCAINE 2% (20 MG/ML) 5 ML SYRINGE
INTRAMUSCULAR | Status: DC | PRN
  Administered 2020-01-22: 60 mg via INTRAVENOUS

## 2020-01-22 MED ORDER — EPHEDRINE 5 MG/ML INJ
INTRAVENOUS | Status: AC
  Filled 2020-01-22: qty 10

## 2020-01-22 MED ORDER — SODIUM CHLORIDE 0.9 % IR SOLN
Status: DC | PRN
  Administered 2020-01-22: 3000 mL

## 2020-01-22 MED ORDER — CEFAZOLIN SODIUM-DEXTROSE 2-4 GM/100ML-% IV SOLN
2.0000 g | Freq: Once | INTRAVENOUS | Status: AC
  Administered 2020-01-22: 2 g via INTRAVENOUS

## 2020-01-22 MED ORDER — ONDANSETRON HCL 4 MG/2ML IJ SOLN
INTRAMUSCULAR | Status: AC
  Filled 2020-01-22: qty 4

## 2020-01-22 MED ORDER — LIDOCAINE HCL (PF) 2 % IJ SOLN
INTRAMUSCULAR | Status: AC
  Filled 2020-01-22: qty 15

## 2020-01-22 MED ORDER — FENTANYL CITRATE (PF) 100 MCG/2ML IJ SOLN: 25.0000 ug | INTRAMUSCULAR | Status: DC | PRN

## 2020-01-22 MED ORDER — OXYCODONE-ACETAMINOPHEN 5-325 MG PO TABS: 1.0000 | ORAL_TABLET | ORAL | 0 refills | Status: DC | PRN

## 2020-01-22 MED ORDER — OXYCODONE HCL 5 MG/5ML PO SOLN: 5.0000 mg | Freq: Once | ORAL | Status: DC | PRN

## 2020-01-22 MED ORDER — OXYBUTYNIN CHLORIDE ER 10 MG PO TB24: 10.0000 mg | ORAL_TABLET | Freq: Every day | ORAL | 0 refills | Status: AC

## 2020-01-22 MED ORDER — OXYBUTYNIN CHLORIDE ER 10 MG PO TB24: 10.0000 mg | ORAL_TABLET | Freq: Every day | ORAL | 0 refills | Status: DC

## 2020-01-22 MED ORDER — DOCUSATE SODIUM 100 MG PO CAPS: 100.0000 mg | ORAL_CAPSULE | Freq: Every day | ORAL | 0 refills | Status: DC | PRN

## 2020-01-22 MED ORDER — PROPOFOL 10 MG/ML IV BOLUS
INTRAVENOUS | Status: AC
  Filled 2020-01-22: qty 20

## 2020-01-22 MED ORDER — FENTANYL CITRATE (PF) 100 MCG/2ML IJ SOLN
INTRAMUSCULAR | Status: DC | PRN
  Administered 2020-01-22: 50 ug via INTRAVENOUS

## 2020-01-22 MED ORDER — FENTANYL CITRATE (PF) 100 MCG/2ML IJ SOLN
INTRAMUSCULAR | Status: AC
  Filled 2020-01-22: qty 2

## 2020-01-22 MED ORDER — DEXAMETHASONE SODIUM PHOSPHATE 4 MG/ML IJ SOLN
INTRAMUSCULAR | Status: DC | PRN
  Administered 2020-01-22: 10 mg via INTRAVENOUS

## 2020-01-22 MED ORDER — KETOROLAC TROMETHAMINE 30 MG/ML IJ SOLN
INTRAMUSCULAR | Status: AC
  Filled 2020-01-22: qty 3

## 2020-01-22 MED ORDER — ONDANSETRON HCL 4 MG/2ML IJ SOLN
INTRAMUSCULAR | Status: DC | PRN
  Administered 2020-01-22: 4 mg via INTRAVENOUS

## 2020-01-22 MED ORDER — LACTATED RINGERS IV SOLN: INTRAVENOUS | Status: DC

## 2020-01-22 MED ORDER — PROPOFOL 10 MG/ML IV BOLUS
INTRAVENOUS | Status: DC | PRN
  Administered 2020-01-22: 150 mg via INTRAVENOUS

## 2020-01-22 MED ORDER — EPHEDRINE SULFATE-NACL 50-0.9 MG/10ML-% IV SOSY
PREFILLED_SYRINGE | INTRAVENOUS | Status: DC | PRN
  Administered 2020-01-22: 5 mg via INTRAVENOUS

## 2020-01-22 MED ORDER — OXYCODONE HCL 5 MG PO TABS: 5.0000 mg | ORAL_TABLET | Freq: Once | ORAL | Status: DC | PRN

## 2020-01-22 MED ORDER — ONDANSETRON HCL 4 MG/2ML IJ SOLN: 4.0000 mg | Freq: Once | INTRAMUSCULAR | Status: DC | PRN

## 2020-01-22 SURGICAL SUPPLY — 29 items
BAG DRAIN URO-CYSTO SKYTR STRL (DRAIN) ×3 IMPLANT
BAG DRN RND TRDRP ANRFLXCHMBR (UROLOGICAL SUPPLIES) ×2
BAG DRN UROCATH (DRAIN) ×2
BAG URINE DRAIN 2000ML AR STRL (UROLOGICAL SUPPLIES) ×3 IMPLANT
BAG URINE LEG 500ML (DRAIN) IMPLANT
CATH FOLEY 2WAY SLVR  5CC 18FR (CATHETERS) ×1
CATH FOLEY 2WAY SLVR  5CC 22FR (CATHETERS)
CATH FOLEY 2WAY SLVR 30CC 20FR (CATHETERS) IMPLANT
CATH FOLEY 2WAY SLVR 5CC 18FR (CATHETERS) ×2 IMPLANT
CATH FOLEY 2WAY SLVR 5CC 22FR (CATHETERS) IMPLANT
CATH SET URETHRAL DILATOR (CATHETERS) IMPLANT
CATH URET 5FR 28IN OPEN ENDED (CATHETERS) IMPLANT
CLOTH BEACON ORANGE TIMEOUT ST (SAFETY) ×3 IMPLANT
ELECT REM PT RETURN 15FT ADLT (MISCELLANEOUS) ×3 IMPLANT
EVACUATOR MICROVAS BLADDER (UROLOGICAL SUPPLIES) IMPLANT
GLOVE BIO SURGEON STRL SZ 6.5 (GLOVE) ×6 IMPLANT
GLOVE BIO SURGEON STRL SZ7 (GLOVE) IMPLANT
GOWN STRL REUS W/ TWL LRG LVL3 (GOWN DISPOSABLE) ×4 IMPLANT
GOWN STRL REUS W/TWL LRG LVL3 (GOWN DISPOSABLE) ×6
GUIDEWIRE ZIPWRE .038 STRAIGHT (WIRE) IMPLANT
HOLDER FOLEY CATH W/STRAP (MISCELLANEOUS) ×3 IMPLANT
KIT TURNOVER CYSTO (KITS) ×3 IMPLANT
LOOP CUT BIPOLAR 24F LRG (ELECTROSURGICAL) ×3 IMPLANT
MANIFOLD NEPTUNE II (INSTRUMENTS) IMPLANT
PACK CYSTO (CUSTOM PROCEDURE TRAY) ×3 IMPLANT
SYR 10ML LL (SYRINGE) ×3 IMPLANT
SYR TOOMEY IRRIG 70ML (MISCELLANEOUS)
SYRINGE TOOMEY IRRIG 70ML (MISCELLANEOUS) IMPLANT
TUBE CONNECTING 12X1/4 (SUCTIONS) IMPLANT

## 2020-01-22 NOTE — Anesthesia Preprocedure Evaluation (Addendum)
Anesthesia Evaluation  Patient identified by MRN, date of birth, ID band Patient awake    Reviewed: Allergy & Precautions, NPO status , Patient's Chart, lab work & pertinent test results  History of Anesthesia Complications Negative for: history of anesthetic complications  Airway Mallampati: I  TM Distance: >3 FB Neck ROM: Full    Dental  (+) Dental Advisory Given, Teeth Intact   Pulmonary former smoker,    Pulmonary exam normal        Cardiovascular negative cardio ROS Normal cardiovascular exam     Neuro/Psych negative neurological ROS  negative psych ROS   GI/Hepatic negative GI ROS, Neg liver ROS,   Endo/Other  negative endocrine ROS  Renal/GU negative Renal ROS    Bladder cancer      Musculoskeletal  (+) Arthritis , Osteoarthritis,    Abdominal   Peds  Hematology negative hematology ROS (+)   Anesthesia Other Findings Covid test negative   Reproductive/Obstetrics                            Anesthesia Physical Anesthesia Plan  ASA: II  Anesthesia Plan: General   Post-op Pain Management:    Induction: Intravenous  PONV Risk Score and Plan: 3 and Treatment may vary due to age or medical condition, Ondansetron and Dexamethasone  Airway Management Planned: LMA  Additional Equipment: None  Intra-op Plan:   Post-operative Plan: Extubation in OR  Informed Consent: I have reviewed the patients History and Physical, chart, labs and discussed the procedure including the risks, benefits and alternatives for the proposed anesthesia with the patient or authorized representative who has indicated his/her understanding and acceptance.     Dental advisory given  Plan Discussed with: CRNA and Anesthesiologist  Anesthesia Plan Comments:        Anesthesia Quick Evaluation

## 2020-01-22 NOTE — Anesthesia Procedure Notes (Signed)
Procedure Name: LMA Insertion Date/Time: 01/22/2020 12:19 PM Performed by: Hewitt Blade, CRNA Pre-anesthesia Checklist: Patient identified, Emergency Drugs available, Suction available and Patient being monitored Patient Re-evaluated:Patient Re-evaluated prior to induction Oxygen Delivery Method: Circle system utilized Preoxygenation: Pre-oxygenation with 100% oxygen Induction Type: IV induction Ventilation: Mask ventilation without difficulty LMA: LMA inserted LMA Size: 5.0 Number of attempts: 1 Placement Confirmation: positive ETCO2 and breath sounds checked- equal and bilateral Tube secured with: Tape Dental Injury: Teeth and Oropharynx as per pre-operative assessment

## 2020-01-22 NOTE — H&P (Signed)
Office Visit Report     11/19/2019   --------------------------------------------------------------------------------   Douglas Holmes. [place]kee  MRN: [id number]  DOB: 1945-06-19, 75 year old Male  SSN: [id number]   PRIMARY CARE:  Deland Pretty, MD  REFERRING:  Deland Pretty, MD  PROVIDER:  Rexene Alberts, M.D.  LOCATION:  [facility] [phone]     --------------------------------------------------------------------------------   CC/HPI: Douglas Holmes is a 75 year old male seen in follow-up with bladder cancer.   He was incidentally noted to have a bladder tumor at the time of his TURP. He underwent a TURP and small TURBT in 05/2012. Pathology revealed LG Ta UCB. He underwent repeat TURBT on 11/20/2013 that revealed LG Ta UCB. He is undergone several surveillance cystoscopies most recently on 11/18/2018 and that demonstrated no recurrence. He did not undergo BCG.   He denies gross hematuria. He has stable lower urinary symptoms including sensation of incomplete emptying, frequency, intermittency, weak flow stream, straining to void 1 time nocturia. His IPSS score today is 12, Q OL 2. He does not take an alpha-blocker and is not interested in starting one.   His PSA on 11/12/2019 was normal at 3.14 NG/mL.   Patient currently denies fever, chills, sweats, nausea, vomiting, abdominal or flank pain, gross hematuria or dysuria.      ALLERGIES: No Allergies    MEDICATIONS: Aspirin Ec 81 mg tablet, delayed release Oral  Glucosamine Hcl 1,500 mg tablet  Multivitamins tablet Oral  Omega-3 Krill Oil 1,000 mg-230 mg-60 mg-128 mg-334 mg-50 mcg capsule Oral  Rosuvastatin Calcium 20 mg tablet  Sildenafil Citrate 20 MG Oral Tablet 0 Oral     GU PSH: Cystoscopy - 11/18/2018, 2019, 2019, 2018, 2018, 2017, 2017 Cystoscopy TURBT <2 cm - 2015 Cystoscopy TURP - 2015 Laser Surgery Prostate - 2015       PSH Notes: Cystoscopy With Fulguration Small Lesion (5-46mm), Laser Coagulation Of Prostate,  Transurethral Resection Of Prostate (TURP), Prostate Surgery, Intestinal Surgery, Colotomy   NON-GU PSH: Incise Large Bowel - 2015 Inguinal Hernia Repair < 5 yrs - about 06/16/2018 Unlisted Procedure Intestine - 2015     GU PMH: History of bladder cancer (Stable), He had no evidence of transitional cell carcinoma within the bladder today and it is now been 4 years since he last had a bladder tumor resected. I told him that we could now proceed with cystoscopies on an annual basis. - 2019, (Stable), He had no evidence of recurrent transitional cell carcinoma of the bladder noted cystoscopically today. He will return in 6 months for repeat surveillance cystoscopy in if things look clear than he will advance to yearly cystoscopies., - 2019 (Stable), He had no evidence of recurrent transitional cell carcinoma the bladder. He will continue with surveillance cystoscopy again in 6 months., - 2018, He had no evidence of recurrent transitional cell carcinoma of the bladder noted cystoscopically today. I will continue to monitor him every 6 months., - 2018, History of malignant neoplasm of bladder, - 2017 Encounter for Prostate Cancer screening, His prostate is noted to be smooth and benign on exam today. His PSA is normal at 3.47. - 2018 BPH w/o LUTS (Stable), he does have BPH cystoscopically and has changes noted of the bladder wall consistent with outlet obstruction but does not have any voiding symptoms. - 2017 Elevated PSA, Rising PSA level - 2016 Male ED, unspecified, Erectile dysfunction - 2016 Nocturia, Nocturia - 2015 History of urolithiasis, History of renal calculi - 2015      PMH Notes:  History of bladder cancer: He was incidentally noted to have a bladder tumor at the time of the TURP.  TURBT - 5/14. Pathology: Papillary, single (Ta,G1).  Repeat TURBT 11/20/13. Pathology: Papillary, single (Ta,G1)   BPH with outlet obstruction: He was managed initially with maximum medical management using  Proscar and Uroxatral. He underwent interstitial laser coagulation in '04. He underwent a TURP in 5/14 which resulted in significant improvement in his voiding symptoms.   History of nephrolithiasis: He has a history of calculus disease and underwent ureteroscopy for a left distal ureteral stone in 3/13. 24-hour urine - elevated urine uric acid, sodium and low volume.   Erectile dysfunction: This has been managed with Viagra 100 mg. using one half a pill.  Current therapy: Sildenafil   Rising PSA: His PSA increased from 0.9 in 2013 to 2.4 in 1/15. He was noted at that time to have no abnormality on DRE.   4/16 - 2.67     NON-GU PMH: Encounter for general adult medical examination without abnormal findings, Encounter for preventive health examination - 2015 Personal history of other endocrine, nutritional and metabolic disease, History of hypercholesterolemia - 2015    FAMILY HISTORY: CAD (coronary artery disease) - Runs In Family Deceased - Runs In Family malignant neoplasm - Runs In Family   SOCIAL HISTORY: Marital Status: Married Preferred Language: English; Race: White Current Smoking Status: Patient does not smoke anymore. Has not smoked since 08/18/2018. Smoked less than 1/2 pack per day.  Does not use smokeless tobacco. Social Drinker.  Does not use drugs. Drinks 2 caffeinated drinks per day. Has not had a blood transfusion.     Notes: Former smoker, Caffeine use, Alcohol use, Retired, Married, Number of children   REVIEW OF SYSTEMS:    GU Review Male:   Patient denies frequent urination, hard to postpone urination, burning/ pain with urination, get up at night to urinate, leakage of urine, stream starts and stops, trouble starting your stream, have to strain to urinate , erection problems, and penile pain.  Gastrointestinal (Upper):   Patient denies nausea, vomiting, and indigestion/ heartburn.  Gastrointestinal (Lower):   Patient denies diarrhea and constipation.   Constitutional:   Patient denies fever, fatigue, night sweats, and weight loss.  Skin:   Patient denies skin rash/ lesion and itching.  Eyes:   Patient denies blurred vision and double vision.  Ears/ Nose/ Throat:   Patient denies sore throat and sinus problems.  Hematologic/Lymphatic:   Patient denies swollen glands and easy bruising.  Cardiovascular:   Patient denies leg swelling and chest pains.  Respiratory:   Patient denies cough and shortness of breath.  Endocrine:   Patient denies excessive thirst.  Musculoskeletal:   Patient denies back pain and joint pain.  Neurological:   Patient denies headaches and dizziness.  Psychologic:   Patient denies depression and anxiety.   VITAL SIGNS:      11/19/2019 11:00 AM  Weight 220 lb / 99.79 kg  Height 74 in / 187.96 cm  BP 103/69 mmHg  Pulse 90 /min  Temperature 97.3 F / 36.2 C  BMI 28.2 kg/m   GU PHYSICAL EXAMINATION:    Anus and Perineum: No hemorrhoids. No anal stenosis. No rectal fissure, no anal fissure. No edema, no dimple, no perineal tenderness, no anal tenderness.  Scrotum: No lesions. No edema. No cysts. No warts.  Epididymides: Right: no spermatocele, no masses, no cysts, no tenderness, no induration, no enlargement. Left: no spermatocele, no masses, no cysts, no  tenderness, no induration, no enlargement.  Testes: No tenderness, no swelling, no enlargement left testes. No tenderness, no swelling, no enlargement right testes. Normal location left testes. Normal location right testes. No mass, no cyst, no varicocele, no hydrocele left testes. No mass, no cyst, no varicocele, no hydrocele right testes.  Urethral Meatus: Normal size. No lesion, no wart, no discharge, no polyp. Normal location.  Penis: Circumcised, no warts, no cracks. No dorsal Peyronie's plaques, no left corporal Peyronie's plaques, no right corporal Peyronie's plaques, no scarring, no warts. No balanitis, no meatal stenosis.  Prostate: 40 gram or 2+ size. Left  lobe normal consistency, right lobe normal consistency. Symmetrical lobes. No prostate nodule. Left lobe no tenderness, right lobe no tenderness.  Seminal Vesicles: Nonpalpable.  Sphincter Tone: Normal sphincter. No rectal tenderness. No rectal mass.    MULTI-SYSTEM PHYSICAL EXAMINATION:    Constitutional: Well-nourished. No physical deformities. Normally developed. Good grooming.  Respiratory: No labored breathing, no use of accessory muscles.   Cardiovascular: Normal temperature, normal extremity pulses, no swelling, no varicosities.  Gastrointestinal: No mass, no tenderness, no rigidity, non obese abdomen.     Complexity of Data:  Source Of History:  Patient, Medical Record Summary  Lab Test Review:   PSA  Records Review:   AUA Symptom Score, Pathology Reports, Previous Doctor Records, IIEF Score  Urine Test Review:   Urinalysis   11/12/19 11/11/18 11/14/17 11/04/17 10/23/16 11/22/14 05/13/14 07/22/13  PSA  Total PSA 3.14 ng/mL 3.17 ng/mL 2.68 ng/mL 2.79 ng/mL 3.47 ng/dl 3.05 ng/dl 2.67  2.70     PROCEDURES:         Flexible Cystoscopy - 52000  Risks, benefits, and some of the potential complications of the procedure were discussed at length with the patient including infection, bleeding, voiding discomfort, urinary retention, fever, chills, sepsis, and others. All questions were answered. Informed consent was obtained. Antibiotic prophylaxis was given. Sterile technique and intraurethral analgesia were used.  Meatus:  Normal size. Normal location. Normal condition.  Urethra:  No strictures.  External Sphincter:  Normal.  Verumontanum:  Normal.  Prostate:  Moderately obstructing with moderate hyperplasia  Bladder Neck:  Non-obstructing.  Ureteral Orifices:  Normal location. Normal size. Normal shape. Effluxed clear urine.  Bladder:  Subcentimeter lesion in the left posterior-lateral aspect of the bladder      The lower urinary tract was carefully examined. The procedure was  well-tolerated and without complications. Antibiotic instructions were given. Instructions were given to call the office immediately for bloody urine, difficulty urinating, urinary retention, painful or frequent urination, fever, chills, nausea, vomiting or other illness. The patient stated that he understood these instructions and would comply with them.         Urinalysis Dipstick Dipstick Cont'd  Color: Yellow Bilirubin: Neg mg/dL  Appearance: Clear Ketones: Neg mg/dL  Specific Gravity: 1.025 Blood: Neg ery/uL  pH: <=5.0 Protein: Neg mg/dL  Glucose: Neg mg/dL Urobilinogen: 0.2 mg/dL    Nitrites: Neg    Leukocyte Esterase: Neg leu/uL    ASSESSMENT:      ICD-10 Details  1 GU:   History of bladder cancer - Z85.51   2   BPH w/o LUTS - N40.0   3   Encounter for Prostate Cancer screening - Z12.5    PLAN:           Schedule Return Visit/Planned Activity: 1 Year - Office Visit, Cystoscopy          Document Letter(s):  Created for Patient: Clinical Summary  Notes:   1. Low risk non-muscle invasive bladder cancer:  TURBT hx:  -05/2012: LG Ta UCB  -11/20/2013 LG Ta UCB  -Cystoscopy 11/19/2019 demonstrated a subcentimeter low-grade papillary lesion on the left posterior aspect of his bladder. Recommend cystoscopy, bladder biopsy, TURBT small, instillation of gemcitabine in the OR. Surgery letter sent.   #2. Prostate cancer screening: DRE today benign. PSA on 11/04/2019 was normal at 3.14 NG/mL.   3. BPH with LUTS: IPSS 12. Does not want to start alpha blocker. Tried this previously with not much benefit. S/p TURP in 2014.   CC: Merri Brunette, MD    Signed by Jettie Pagan, M.D. on 11/19/19 at 12:24 PM (EDT)  Urology Preoperative H&P   Chief Complaint: Bladder cancer  History of Present Illness: Douglas Holmes is a 75 y.o. male with with hx of LG Ta UCB with subcentimeter LG papillary lesion on the left posterior aspect of his bladder here for biopsy, TURBT small, instillation  of gemcitabine.  Past Medical History:  Diagnosis Date  . Bladder mass   . BPH (benign prostatic hypertrophy)   . Diverticulosis of colon   . ED (erectile dysfunction)   . Focal dystonia NEUROLOGIST-  DR Huston Foley   RIGHT HAND AND ARM  . Generalized OA    FINGERS  . History of bladder cancer urologist--  dr Vernie Ammons   05/ 2014  single papillary (Ta G1) s/p TURBT and 11/ 2015 s/p TURBT  . History of diverticulitis   . History of kidney stones   . Hx SBO    2007 s/p  colon resection;  2017 medically management  . Hyperlipidemia   . Nocturia   . Wears glasses   . Wears hearing aid in both ears     Past Surgical History:  Procedure Laterality Date  . EXPLORATORY LAPAROTOMY W/ BOWEL RESECTION  11/ 2007   SBO  . GREEN LIGHT LASER TURP (TRANSURETHRAL RESECTION OF PROSTATE  2004  . HAND SURGERY  2016   sutures where pt fell and cut hand  . INGUINAL HERNIA REPAIR Right 07/04/2018   Procedure: OPEN RIGHT INGUINAL HERNIA REPAIR WITH MESH;  Surgeon: Darnell Level, MD;  Location: [facility];  Service: General;  Laterality: Right;  . LEFT URETEROSCOPIC STONE EXTRACTION  3/ 2013  . PARTIAL COLECTOMY  1988   diverticulitis  . TRANSURETHRAL RESECTION OF BLADDER TUMOR N/A 11/20/2013   Procedure: TRANSURETHRAL RESECTION OF BLADDER TUMOR (TURBT);  Surgeon: Garnett Farm, MD;  Location: [facility];  Service: Urology;  Laterality: N/A;  . TRANSURETHRAL RESECTION OF PROSTATE  5/ 2014   and Resection Bladder Tumor    Allergies: No Known Allergies  Family History  Problem Relation Age of Onset  . Dementia Father   . Cancer Mother   . Hypertension Mother   . Colon cancer Other        Family hx  . Heart failure Brother   . Coronary artery disease Brother     Social History:  reports that he quit smoking about 26 years ago. His smoking use included cigarettes. He has a 30.00 pack-year smoking history. He has never used smokeless tobacco. He reports current alcohol use of about  3.0 standard drinks of alcohol per week. He reports that he does not use drugs.  ROS: A complete review of systems was performed.  All systems are negative except for pertinent findings as noted.  Physical Exam:  Vital signs in last 24 hours: Temp:  [67  F (36.1 C)] 97 F (36.1 C) (01/07 1008) Pulse Rate:  [75] 75 (01/07 1008) Resp:  [16] 16 (01/07 1008) BP: (113)/(71) 113/71 (01/07 1008) SpO2:  [97 %] 97 % (01/07 1008) Weight:  [102.2 kg] 102.2 kg (01/07 1008) Constitutional:  Alert and oriented, No acute distress Cardiovascular: Regular rate and rhythm Respiratory: Normal respiratory effort, Lungs clear bilaterally GI: Abdomen is soft, nontender, nondistended, no abdominal masses GU: No CVA tenderness Lymphatic: No lymphadenopathy Neurologic: Grossly intact, no focal deficits Psychiatric: Normal mood and affect  Laboratory Data:  No results for input(s): WBC, HGB, HCT, PLT in the last 72 hours.  No results for input(s): NA, K, CL, GLUCOSE, BUN, CALCIUM, CREATININE in the last 72 hours.  Invalid input(s): CO3   No results found for this or any previous visit (from the past 24 hour(s)). Recent Results (from the past 240 hour(s))  SARS CORONAVIRUS 2 (TAT 6-24 HRS) Nasopharyngeal Nasopharyngeal Swab     Status: None   Collection Time: 01/19/20  3:16 PM   Specimen: Nasopharyngeal Swab  Result Value Ref Range Status   SARS Coronavirus 2 NEGATIVE NEGATIVE Final    Comment: (NOTE) SARS-CoV-2 target nucleic acids are NOT DETECTED.  The SARS-CoV-2 RNA is generally detectable in upper and lower respiratory specimens during the acute phase of infection. Negative results do not preclude SARS-CoV-2 infection, do not rule out co-infections with other pathogens, and should not be used as the sole basis for treatment or other patient management decisions. Negative results must be combined with clinical observations, patient history, and epidemiological information. The  expected result is Negative.  Fact Sheet for Patients: SugarRoll.be  Fact Sheet for Healthcare Providers: https://www.woods-mathews.com/  This test is not yet approved or cleared by the [place] FDA and  has been authorized for detection and/or diagnosis of SARS-CoV-2 by FDA under an Emergency Use Authorization (EUA). This EUA will remain  in effect (meaning this test can be used) for the duration of the COVID-19 declaration under Se ction 564(b)(1) of the Act, 21 U.S.C. section 360bbb-3(b)(1), unless the authorization is terminated or revoked sooner.  Performed at [facility], [place] [address] [place], [place] [address]     Renal Function: No results for input(s): CREATININE in the last 168 hours. CrCl cannot be calculated (Patient's most recent lab result is older than the maximum 21 days allowed.).  Radiologic Imaging: No results found.  I independently reviewed the above imaging studies.  Assessment and Plan Dimitry Holsworth is a 75 y.o. male with hx of LG Ta UCB with subcentimeter LG papillary lesion on the left posterior aspect of his bladder here for biopsy, TURBT small, instillation of gemcitabine.  Matt R. Areeba Sulser MD 01/22/2020, 11:23 AM  [facility] Pager: [phone]): [phone]

## 2020-01-22 NOTE — Discharge Instructions (Signed)
·   Activity:  You are encouraged to ambulate frequently (about every hour during waking hours) to help prevent blood clots from forming in your legs or lungs.     Diet: You should advance your diet as instructed by your physician.  It will be normal to have some bloating, nausea, and abdominal discomfort intermittently.   Prescriptions:  You will be provided a prescription for pain medication to take as needed.  If your pain is not severe enough to require the prescription pain medication, you may take extra strength Tylenol instead which will have less side effects.  You should also take a prescribed stool softener to avoid straining with bowel movements as the prescription pain medication may constipate you.   What to call us about: You should call the office [phone]) if you develop fever > 101 or develop persistent vomiting. Activity:  You are encouraged to ambulate frequently (about every hour during waking hours) to help prevent blood clots from forming in your legs or lungs.  You have a Foley catheter draining your bladder.  This may be removed on Tuesday morning with provided 10 cc syringe for in the office.   Post Anesthesia Home Care Instructions  Activity: Get plenty of rest for the remainder of the day. A responsible adult should stay with you for 24 hours following the procedure.  For the next 24 hours, DO NOT: -Drive a car -Paediatric nurse -Drink alcoholic beverages -Take any medication unless instructed by your physician -Make any legal decisions or sign important papers.  Meals: Start with liquid foods such as gelatin or soup. Progress to regular foods as tolerated. Avoid greasy, spicy, heavy foods. If nausea and/or vomiting occur, drink only clear liquids until the nausea and/or vomiting subsides. Call your physician if vomiting continues.  Special Instructions/Symptoms: Your throat may feel dry or sore from the anesthesia or the breathing tube placed in your  throat during surgery. If this causes discomfort, gargle with warm salt water. The discomfort should disappear within 24 hours.  If you had a scopolamine patch placed behind your ear for the management of post- operative nausea and/or vomiting:  1. The medication in the patch is effective for 72 hours, after which it should be removed.  Wrap patch in a tissue and discard in the trash. Wash hands thoroughly with soap and water. 2. You may remove the patch earlier than 72 hours if you experience unpleasant side effects which may include dry mouth, dizziness or visual disturbances. 3. Avoid touching the patch. Wash your hands with soap and water after contact with the patch.

## 2020-01-22 NOTE — Transfer of Care (Signed)
Immediate Anesthesia Transfer of Care Note  Patient: Douglas Holmes  Procedure(s) Performed: TRANSURETHRAL RESECTION OF BLADDER TUMOR (TURBT)/ CYSTOSCOPY/INSTILLATION OF GEMCITABINE/ BLADDER BIOPSY (N/A )  Patient Location: PACU  Anesthesia Type:General  Level of Consciousness: awake  Airway & Oxygen Therapy: Patient Spontanous Breathing and Patient connected to nasal cannula oxygen  Post-op Assessment: Report given to RN and Post -op Vital signs reviewed and stable  Post vital signs: Reviewed and stable  Last Vitals:  Vitals Value Taken Time  BP 107/75 01/22/20 1301  Temp 36.4 C 01/22/20 1300  Pulse 72 01/22/20 1306  Resp 16 01/22/20 1306  SpO2 97 % 01/22/20 1306  Vitals shown include unvalidated device data.  Last Pain:  Vitals:   01/22/20 1008  TempSrc: Oral  PainSc: 0-No pain      Patients Stated Pain Goal: 5 (29/24/46 2863)  Complications: No complications documented.

## 2020-01-22 NOTE — Anesthesia Postprocedure Evaluation (Signed)
Anesthesia Post Note  Patient: Douglas Holmes  Procedure(s) Performed: CYSTOSCOPY WITH BLADDER  BIOPSY (Bladder)     Patient location during evaluation: PACU Anesthesia Type: General Level of consciousness: awake and alert Pain management: pain level controlled Vital Signs Assessment: post-procedure vital signs reviewed and stable Respiratory status: spontaneous breathing, nonlabored ventilation and respiratory function stable Cardiovascular status: blood pressure returned to baseline and stable Postop Assessment: no apparent nausea or vomiting Anesthetic complications: no   No complications documented.  Last Vitals:  Vitals:   01/22/20 1008 01/22/20 1300  BP: 113/71 107/75  Pulse: 75   Resp: 16 10  Temp: (!) 36.1 C (!) 36.4 C  SpO2: 97% 97%    Last Pain:  Vitals:   01/22/20 1300  TempSrc:   PainSc: 0-No pain                 Audry Pili

## 2020-01-22 NOTE — Op Note (Signed)
Operative Note  Preoperative diagnosis:  1. History of LG Ta UCB  Postoperative diagnosis: 1.  History of LG Ta UCB  Procedure(s): 1. Cystoscopy with bladder biopsy 2. Fulguration of bladder lesions  Surgeon: Rexene Alberts, MD  Assistants:  None  Anesthesia:  General  Complications:  None  EBL:  Minimal  Specimens: 1.  ID Type Source Tests Collected by Time Destination  1 :  Tissue PATH GU tumor resection SURGICAL PATHOLOGY Janith Lima, MD 01/22/2020 1049     Drains/Catheters: 1. 77 French Foley catheter placed 10 cc water in the balloon  Intraoperative findings:   1. 3 separate erythematous areas on the posterior aspect of the bladder in the posterior left aspect.  1 area which was close to a posterior left diverticulum at suspicion of CIS.  Number of tumors:     3 Size of largest tumor:         39mm  Characteristics of tumors:       Flat yes  Recurrent  yes    Primary   No  Suspicious for Carcinoma in situ:   Yes  Clinical tumor stage:        cTa    Bimanual exam under anesthesia:        Yes  Visually complete resection:               Yes  Visualization of detrusor muscle in resection base:      Yes   Visual evaluation for perforation:           Yes (none)   Indication:  Douglas Holmes is a 75 y.o. male with a history of low risk nonmuscle invasive bladder cancer with TURBT in 2014 and 2015 with low-grade TA UCB.  Cystoscopy 11/19/2019 demonstrated subcentimeter suspicious lesions in the posterior left aspect of his bladder. All the risks, benefits were discussed with the patient to include but not limited to infection, pain, bleeding, damage to adjacent structures, need for further operations, adverse reaction to anesthesia and death.  Patient understands these risks and agrees to proceed with the operation as planned.    Description of procedure: After informed consent was obtained from the patient, the patient was taken to the operating room. General  anesthesia was administered. The patient was placed in dorsal lithotomy position and prepped and draped in usual sterile fashion. Sequential compression devices were applied to lower extremities at the beginning of the case for DVT prophylaxis. Antibiotics were infused prior to surgery start time. A surgical time-out was performed to properly identify the patient, the surgery to be performed, and the surgical site.     We then passed the 21-French rigid cystoscope down the urethra and into the bladder under direct vision without any difficulty. The anterior urethral was normal. The prostate was obstructing with bilobar obstruction. The bladder was inspected with 30 and 70 degree lenses. Once in the bladder, systematic evaluation of bladder revealed 3 separate areas of suspicious erythematous lesions on the posterior aspect of the bladder. The ureteral orfices were in orthotopic position and not involved.   As the suspicious lesions were small, we elected to proceed with rigid forcep biopsy of these lesions.  These were sent off for pathology.  There was one area close to an adjacent left posterior diverticulum. The biopsy site was obtained adjacent to the diverticulum and I fulgurated the lesion that extended within this diverticulum. I also fulgurated the prior biopsy sites.  On one of the posterior sites, the  biopsy was taken deep into the level of the muscle.  There was no fat identified.  Hemostasis was achieved using electrocautery. We then proceeded with removing the resectoscope and then placed in an 18 Fr Foley catheter. The patient tolerated the procedure well with no complication and was awoken from anesthesia and taken to recovery in stable condition.     Plan: Leave Foley catheter in place until Tuesday morning.  He may remove Foley catheter with provided 10 cc syringe for follow-up in the office for catheter removal.  Matt R. [facility]  Pager: [phone]

## 2020-01-23 ENCOUNTER — Other Ambulatory Visit: Payer: Self-pay

## 2020-01-23 ENCOUNTER — Emergency Department (HOSPITAL_COMMUNITY)
Admission: EM | Admit: 2020-01-23 | Discharge: 2020-01-24 | Disposition: A | Discharge status: Home / Self Care | Payer: Medicare HMO | Attending: Emergency Medicine | Admitting: Emergency Medicine

## 2020-01-23 ENCOUNTER — Encounter (HOSPITAL_COMMUNITY): Payer: Self-pay

## 2020-01-23 DIAGNOSIS — Z5321 Procedure and treatment not carried out due to patient leaving prior to being seen by health care provider: Secondary | ICD-10-CM | POA: Insufficient documentation

## 2020-01-23 DIAGNOSIS — Z466 Encounter for fitting and adjustment of urinary device: Secondary | ICD-10-CM | POA: Insufficient documentation

## 2020-01-23 NOTE — ED Notes (Signed)
Pt was told the longest wait was 10 hours after asking this NT multiple times.  Pt proceeded to yell at this NT and said he was leaving.

## 2020-01-23 NOTE — ED Triage Notes (Signed)
Pt arrived via walk in, c/o foley catheter not working. S/p bladder biopsy 01/22/19, foley placed then. Still having urge to urinate, states he is able to urinate, but urine is going around catheter. States he feels he is not retaining.

## 2020-01-23 NOTE — ED Notes (Signed)
Bladder scan 99.

## 2020-01-25 ENCOUNTER — Encounter (HOSPITAL_BASED_OUTPATIENT_CLINIC_OR_DEPARTMENT_OTHER): Payer: Self-pay | Admitting: Urology

## 2020-01-25 ENCOUNTER — Other Ambulatory Visit: Payer: Self-pay | Admitting: Podiatry

## 2020-01-25 LAB — SURGICAL PATHOLOGY

## 2020-01-25 NOTE — Telephone Encounter (Signed)
Please advise 

## 2020-01-26 NOTE — Telephone Encounter (Signed)
Caryl Pina would you please look into this and see why it was canceled in the first place and if we need to refill something else?  Thank you

## 2020-01-29 DIAGNOSIS — N400 Enlarged prostate without lower urinary tract symptoms: Secondary | ICD-10-CM | POA: Diagnosis not present

## 2020-01-29 DIAGNOSIS — Z8551 Personal history of malignant neoplasm of bladder: Secondary | ICD-10-CM | POA: Diagnosis not present

## 2020-04-19 DIAGNOSIS — I2510 Atherosclerotic heart disease of native coronary artery without angina pectoris: Secondary | ICD-10-CM | POA: Diagnosis not present

## 2020-04-19 DIAGNOSIS — Z8551 Personal history of malignant neoplasm of bladder: Secondary | ICD-10-CM | POA: Diagnosis not present

## 2020-04-19 DIAGNOSIS — N529 Male erectile dysfunction, unspecified: Secondary | ICD-10-CM | POA: Diagnosis not present

## 2020-04-19 DIAGNOSIS — Z0001 Encounter for general adult medical examination with abnormal findings: Secondary | ICD-10-CM | POA: Diagnosis not present

## 2020-04-19 DIAGNOSIS — L299 Pruritus, unspecified: Secondary | ICD-10-CM | POA: Diagnosis not present

## 2020-04-20 DIAGNOSIS — D2272 Melanocytic nevi of left lower limb, including hip: Secondary | ICD-10-CM | POA: Diagnosis not present

## 2020-04-20 DIAGNOSIS — D225 Melanocytic nevi of trunk: Secondary | ICD-10-CM | POA: Diagnosis not present

## 2020-04-20 DIAGNOSIS — D220 Melanocytic nevi of lip: Secondary | ICD-10-CM | POA: Diagnosis not present

## 2020-04-20 DIAGNOSIS — D1801 Hemangioma of skin and subcutaneous tissue: Secondary | ICD-10-CM | POA: Diagnosis not present

## 2020-04-20 DIAGNOSIS — L821 Other seborrheic keratosis: Secondary | ICD-10-CM | POA: Diagnosis not present

## 2020-04-20 DIAGNOSIS — L814 Other melanin hyperpigmentation: Secondary | ICD-10-CM | POA: Diagnosis not present

## 2020-04-20 DIAGNOSIS — D2271 Melanocytic nevi of right lower limb, including hip: Secondary | ICD-10-CM | POA: Diagnosis not present

## 2020-04-27 DIAGNOSIS — Z8551 Personal history of malignant neoplasm of bladder: Secondary | ICD-10-CM | POA: Diagnosis not present

## 2020-04-27 DIAGNOSIS — N400 Enlarged prostate without lower urinary tract symptoms: Secondary | ICD-10-CM | POA: Diagnosis not present

## 2020-05-03 ENCOUNTER — Ambulatory Visit: Payer: Medicare HMO | Admitting: Podiatry

## 2020-05-03 ENCOUNTER — Encounter: Payer: Self-pay | Admitting: Podiatry

## 2020-05-03 ENCOUNTER — Other Ambulatory Visit: Payer: Self-pay

## 2020-05-03 DIAGNOSIS — L603 Nail dystrophy: Secondary | ICD-10-CM

## 2020-05-03 NOTE — Progress Notes (Signed)
He presents today for follow-up of his onychomycosis he is completed his second every other day dose and states that he is doing great denies fever chills nausea vomiting muscle aches and pains.  Objective: It appears that his toenails are 100% grown out at this point.  No signs of tinea pedis.  Assessment: Well healed onychomycosis.  Plan: Follow-up with me on an as-needed basis.  Discontinuing Lamisil.

## 2020-05-31 DIAGNOSIS — Z8551 Personal history of malignant neoplasm of bladder: Secondary | ICD-10-CM | POA: Diagnosis not present

## 2020-05-31 DIAGNOSIS — R030 Elevated blood-pressure reading, without diagnosis of hypertension: Secondary | ICD-10-CM | POA: Diagnosis not present

## 2020-05-31 DIAGNOSIS — Z8249 Family history of ischemic heart disease and other diseases of the circulatory system: Secondary | ICD-10-CM | POA: Diagnosis not present

## 2020-05-31 DIAGNOSIS — Z809 Family history of malignant neoplasm, unspecified: Secondary | ICD-10-CM | POA: Diagnosis not present

## 2020-05-31 DIAGNOSIS — Z825 Family history of asthma and other chronic lower respiratory diseases: Secondary | ICD-10-CM | POA: Diagnosis not present

## 2020-05-31 DIAGNOSIS — Z7982 Long term (current) use of aspirin: Secondary | ICD-10-CM | POA: Diagnosis not present

## 2020-05-31 DIAGNOSIS — N529 Male erectile dysfunction, unspecified: Secondary | ICD-10-CM | POA: Diagnosis not present

## 2020-05-31 DIAGNOSIS — E785 Hyperlipidemia, unspecified: Secondary | ICD-10-CM | POA: Diagnosis not present

## 2020-05-31 DIAGNOSIS — Z87891 Personal history of nicotine dependence: Secondary | ICD-10-CM | POA: Diagnosis not present

## 2020-06-03 DIAGNOSIS — E7800 Pure hypercholesterolemia, unspecified: Secondary | ICD-10-CM | POA: Diagnosis not present

## 2020-11-08 DIAGNOSIS — Z125 Encounter for screening for malignant neoplasm of prostate: Secondary | ICD-10-CM | POA: Diagnosis not present

## 2020-11-15 DIAGNOSIS — N400 Enlarged prostate without lower urinary tract symptoms: Secondary | ICD-10-CM | POA: Diagnosis not present

## 2020-11-15 DIAGNOSIS — Z125 Encounter for screening for malignant neoplasm of prostate: Secondary | ICD-10-CM | POA: Diagnosis not present

## 2020-11-15 DIAGNOSIS — Z8551 Personal history of malignant neoplasm of bladder: Secondary | ICD-10-CM | POA: Diagnosis not present

## 2020-11-25 ENCOUNTER — Other Ambulatory Visit: Payer: Self-pay | Admitting: Urology

## 2020-11-30 NOTE — Patient Instructions (Signed)
DUE TO COVID-19 ONLY ONE VISITOR IS ALLOWED TO COME WITH YOU AND STAY IN THE WAITING ROOM ONLY DURING PRE OP AND PROCEDURE DAY OF SURGERY IF YOU ARE GOING HOME AFTER SURGERY. IF YOU ARE SPENDING THE NIGHT 2 PEOPLE MAY VISIT WITH YOU IN YOUR PRIVATE ROOM AFTER SURGERY UNTIL VISITING  HOURS ARE OVER AT 800 PM AND 1  VISITOR  MAY  SPEND THE NIGHT.                 Douglas Holmes     Your procedure is scheduled on: 12/23/20   Report to [facility] Main  Entrance   Report to admitting at  9:15 AM     Call this number if you have problems the morning of surgery [phone]    Remember: Do not eat food  :After Midnight the night before your surgery,   You may have clear liquids from midnight until ---.5:30 am    CLEAR LIQUID DIET   Foods Allowed                                                                     Foods Excluded  Coffee and tea, regular and decaf                             liquids that you cannot  Plain Jell-O any favor except red or purple                                           see through such as: Fruit ices (not with fruit pulp)                                     milk, soups, orange juice  Iced Popsicles                                    All solid food Carbonated beverages, regular and diet                                    Cranberry, grape and apple juices Sports drinks like Gatorade Lightly seasoned clear broth or consume(fat free) Sugar     BRUSH YOUR TEETH MORNING OF SURGERY AND RINSE YOUR MOUTH OUT, NO CHEWING GUM CANDY OR MINTS.     Take these medicines the morning of surgery with A SIP OF WATER: none                                You may not have any metal on your body including              piercings  Do not wear jewelry, lotions, powders or deodorant             Men may shave face and neck.  Do not bring valuables to the hospital. [place].  Contacts, dentures or bridgework may not  be worn into surgery.     Patients discharged the day of surgery will not be allowed to drive home.  IF YOU ARE HAVING SURGERY AND GOING HOME THE SAME DAY, YOU MUST HAVE AN ADULT TO DRIVE YOU HOME AND BE WITH YOU FOR 24 HOURS. YOU MAY GO HOME BY TAXI OR UBER OR ORTHERWISE, BUT AN ADULT MUST ACCOMPANY YOU HOME AND STAY WITH YOU FOR 24 HOURS.  Name and phone number of your driver:  Special Instructions: N/A              Please read over the following fact sheets you were given: _____________________________________________________________________             [facility] - Preparing for Surgery Before surgery, you can play an important role.  Because skin is not sterile, your skin needs to be as free of germs as possible.  You can reduce the number of germs on your skin by washing with CHG (chlorahexidine gluconate) soap before surgery.  CHG is an antiseptic cleaner which kills germs and bonds with the skin to continue killing germs even after washing. Please DO NOT use if you have an allergy to CHG or antibacterial soaps.  If your skin becomes reddened/irritated stop using the CHG and inform your nurse when you arrive at Short Stay.   You may shave your face/neck. Please follow these instructions carefully:  1.  Shower with CHG Soap the night before surgery and the  morning of Surgery.  2.  If you choose to wash your hair, wash your hair first as usual with your  normal  shampoo.  3.  After you shampoo, rinse your hair and body thoroughly to remove the  shampoo.                            4.  Use CHG as you would any other liquid soap.  You can apply chg directly  to the skin and wash                       Gently with a scrungie or clean washcloth.  5.  Apply the CHG Soap to your body ONLY FROM THE NECK DOWN.   Do not use on face/ open                           Wound or open sores. Avoid contact with eyes, ears mouth and genitals (private parts).                       Wash face,  Genitals  (private parts) with your normal soap.             6.  Wash thoroughly, paying special attention to the area where your surgery  will be performed.  7.  Thoroughly rinse your body with warm water from the neck down.  8.  DO NOT shower/wash with your normal soap after using and rinsing off  the CHG Soap.                9.  Pat yourself dry with a clean towel.  10.  Wear clean pajamas.            11.  Place clean sheets on your bed the night of your first shower and do not  sleep with pets. Day of Surgery : Do not apply any lotions/deodorants the morning of surgery.  Please wear clean clothes to the hospital/surgery center.  FAILURE TO FOLLOW THESE INSTRUCTIONS MAY RESULT IN THE CANCELLATION OF YOUR SURGERY PATIENT SIGNATURE_________________________________  NURSE SIGNATURE__________________________________  ________________________________________________________________________

## 2020-12-01 ENCOUNTER — Encounter (HOSPITAL_COMMUNITY)
Admission: RE | Admit: 2020-12-01 | Discharge: 2020-12-01 | Disposition: A | Discharge status: Home / Self Care | Payer: Medicare HMO | Source: Ambulatory Visit | Attending: Urology | Admitting: Urology

## 2020-12-01 ENCOUNTER — Other Ambulatory Visit: Payer: Self-pay

## 2020-12-01 ENCOUNTER — Encounter (HOSPITAL_COMMUNITY): Payer: Self-pay

## 2020-12-01 VITALS — BP 119/82 | HR 68 | Temp 98.3°F | Resp 16 | Ht 73.0 in | Wt 228.0 lb

## 2020-12-01 DIAGNOSIS — Z01812 Encounter for preprocedural laboratory examination: Secondary | ICD-10-CM | POA: Diagnosis not present

## 2020-12-01 DIAGNOSIS — Z8551 Personal history of malignant neoplasm of bladder: Secondary | ICD-10-CM | POA: Insufficient documentation

## 2020-12-01 LAB — BASIC METABOLIC PANEL
Anion gap: 8 (ref 5–15)
BUN: 12 mg/dL (ref 8–23)
CO2: 21 mmol/L — ABNORMAL LOW (ref 22–32)
Calcium: 8.7 mg/dL — ABNORMAL LOW (ref 8.9–10.3)
Chloride: 107 mmol/L (ref 98–111)
Creatinine, Ser: 0.79 mg/dL (ref 0.61–1.24)
GFR, Estimated: >60 mL/min (ref >60)
Glucose, Bld: 88 mg/dL (ref 70–99)
Potassium: 4.2 mmol/L (ref 3.5–5.1)
Sodium: 136 mmol/L (ref 135–145)

## 2020-12-01 LAB — CBC
HCT: 44.5 % (ref 39.0–52.0)
Hemoglobin: 14.8 g/dL (ref 13.0–17.0)
MCH: 29.8 pg (ref 26.0–34.0)
MCHC: 33.3 g/dL (ref 30.0–36.0)
MCV: 89.7 fL (ref 80.0–100.0)
Platelets: 227 10*3/uL (ref 150–400)
RBC: 4.96 MIL/uL (ref 4.22–5.81)
RDW: 13.3 % (ref 11.5–15.5)
WBC: 7.1 10*3/uL (ref 4.0–10.5)
nRBC: 0 % (ref 0.0–0.2)

## 2020-12-01 NOTE — Progress Notes (Signed)
COVID test- NA Date COVID Vaccine completed: COVID vaccine manufacturer: Pfizer    Moderna   [employer]   PCP - Dr. Teressa Lower Cardiologist - none  Chest x-ray - no EKG - no Stress Test - no ECHO - no Cardiac Cath - NA Pacemaker/ICD device last checked:NA  Sleep Study - no CPAP -   Fasting Blood Sugar - NA Checks Blood Sugar _____ times a day  Blood Thinner Instructions:ASA 81 mg / Dr. Shelia Media Aspirin Instructions:Stop 5 days prior to DOS/ Dr. Abner Greenspan Last Dose:12/17/20  Anesthesia review: no  Patient denies shortness of breath, fever, cough and chest pain at PAT appointment Pt has no SOB with any activities.  Patient verbalized understanding of instructions that were given to them at the PAT appointment. Patient was also instructed that they will need to review over the PAT instructions again at home before surgery. Yes.

## 2020-12-23 ENCOUNTER — Ambulatory Visit (HOSPITAL_COMMUNITY): Payer: Medicare HMO | Admitting: Anesthesiology

## 2020-12-23 ENCOUNTER — Ambulatory Visit (HOSPITAL_COMMUNITY)
Admission: RE | Admit: 2020-12-23 | Discharge: 2020-12-23 | Disposition: A | Discharge status: Home / Self Care | Payer: Medicare HMO | Source: Ambulatory Visit | Attending: Urology | Admitting: Urology

## 2020-12-23 ENCOUNTER — Encounter (HOSPITAL_COMMUNITY): Payer: Self-pay | Admitting: Urology

## 2020-12-23 ENCOUNTER — Encounter (HOSPITAL_COMMUNITY)
Admission: RE | Disposition: A | Discharge status: Home / Self Care | Payer: Self-pay | Source: Ambulatory Visit | Attending: Urology

## 2020-12-23 DIAGNOSIS — Z8551 Personal history of malignant neoplasm of bladder: Secondary | ICD-10-CM

## 2020-12-23 DIAGNOSIS — N401 Enlarged prostate with lower urinary tract symptoms: Secondary | ICD-10-CM | POA: Insufficient documentation

## 2020-12-23 DIAGNOSIS — R3916 Straining to void: Secondary | ICD-10-CM | POA: Insufficient documentation

## 2020-12-23 DIAGNOSIS — R351 Nocturia: Secondary | ICD-10-CM | POA: Insufficient documentation

## 2020-12-23 DIAGNOSIS — Z87891 Personal history of nicotine dependence: Secondary | ICD-10-CM | POA: Insufficient documentation

## 2020-12-23 DIAGNOSIS — D090 Carcinoma in situ of bladder: Secondary | ICD-10-CM | POA: Diagnosis not present

## 2020-12-23 DIAGNOSIS — R531 Weakness: Secondary | ICD-10-CM | POA: Diagnosis not present

## 2020-12-23 DIAGNOSIS — C679 Malignant neoplasm of bladder, unspecified: Secondary | ICD-10-CM | POA: Diagnosis not present

## 2020-12-23 DIAGNOSIS — C671 Malignant neoplasm of dome of bladder: Secondary | ICD-10-CM | POA: Diagnosis not present

## 2020-12-23 HISTORY — PX: TRANSURETHRAL RESECTION OF BLADDER TUMOR: SHX2575

## 2020-12-23 SURGERY — TURBT (TRANSURETHRAL RESECTION OF BLADDER TUMOR)
Anesthesia: General | Site: Bladder

## 2020-12-23 MED ORDER — SODIUM CHLORIDE 0.9 % IR SOLN
Status: DC | PRN
  Administered 2020-12-23: 6000 mL via INTRAVESICAL

## 2020-12-23 MED ORDER — ONDANSETRON HCL 4 MG/2ML IJ SOLN
INTRAMUSCULAR | Status: DC | PRN
  Administered 2020-12-23: 4 mg via INTRAVENOUS

## 2020-12-23 MED ORDER — ACETAMINOPHEN 160 MG/5ML PO SOLN: 325.0000 mg | ORAL | Status: DC | PRN

## 2020-12-23 MED ORDER — ORAL CARE MOUTH RINSE
15.0000 mL | Freq: Once | OROMUCOSAL | Status: AC
  Administered 2020-12-23: 15 mL via OROMUCOSAL

## 2020-12-23 MED ORDER — FENTANYL CITRATE PF 50 MCG/ML IJ SOSY
PREFILLED_SYRINGE | INTRAMUSCULAR | Status: AC
  Filled 2020-12-23: qty 3

## 2020-12-23 MED ORDER — FENTANYL CITRATE PF 50 MCG/ML IJ SOSY: 25.0000 ug | PREFILLED_SYRINGE | INTRAMUSCULAR | Status: DC | PRN

## 2020-12-23 MED ORDER — DEXAMETHASONE SODIUM PHOSPHATE 4 MG/ML IJ SOLN
INTRAMUSCULAR | Status: DC | PRN
  Administered 2020-12-23: 4 mg via INTRAVENOUS

## 2020-12-23 MED ORDER — AMISULPRIDE (ANTIEMETIC) 5 MG/2ML IV SOLN: 10.0000 mg | Freq: Once | INTRAVENOUS | Status: DC | PRN

## 2020-12-23 MED ORDER — OXYCODONE HCL 5 MG PO TABS: 5.0000 mg | ORAL_TABLET | Freq: Once | ORAL | Status: DC | PRN

## 2020-12-23 MED ORDER — OXYCODONE-ACETAMINOPHEN 5-325 MG PO TABS: 1.0000 | ORAL_TABLET | ORAL | 0 refills | Status: DC | PRN

## 2020-12-23 MED ORDER — FENTANYL CITRATE (PF) 100 MCG/2ML IJ SOLN
INTRAMUSCULAR | Status: AC
  Filled 2020-12-23: qty 2

## 2020-12-23 MED ORDER — PROPOFOL 10 MG/ML IV BOLUS
INTRAVENOUS | Status: AC
  Filled 2020-12-23: qty 20

## 2020-12-23 MED ORDER — OXYCODONE HCL 5 MG/5ML PO SOLN: 5.0000 mg | Freq: Once | ORAL | Status: DC | PRN

## 2020-12-23 MED ORDER — PROPOFOL 10 MG/ML IV BOLUS
INTRAVENOUS | Status: DC | PRN
  Administered 2020-12-23: 170 mg via INTRAVENOUS

## 2020-12-23 MED ORDER — LACTATED RINGERS IV SOLN: INTRAVENOUS | Status: DC

## 2020-12-23 MED ORDER — ACETAMINOPHEN 325 MG PO TABS: 325.0000 mg | ORAL_TABLET | ORAL | Status: DC | PRN

## 2020-12-23 MED ORDER — FENTANYL CITRATE (PF) 100 MCG/2ML IJ SOLN
INTRAMUSCULAR | Status: DC | PRN
  Administered 2020-12-23: 25 ug via INTRAVENOUS

## 2020-12-23 MED ORDER — PHENYLEPHRINE HCL (PRESSORS) 10 MG/ML IV SOLN
INTRAVENOUS | Status: DC | PRN
  Administered 2020-12-23 (×2): 40 ug via INTRAVENOUS

## 2020-12-23 MED ORDER — CHLORHEXIDINE GLUCONATE 0.12 % MT SOLN: 15.0000 mL | Freq: Once | OROMUCOSAL | Status: AC

## 2020-12-23 MED ORDER — LIDOCAINE 2% (20 MG/ML) 5 ML SYRINGE
INTRAMUSCULAR | Status: DC | PRN
  Administered 2020-12-23: 60 mg via INTRAVENOUS

## 2020-12-23 MED ORDER — ACETAMINOPHEN 10 MG/ML IV SOLN: 1000.0000 mg | Freq: Once | INTRAVENOUS | Status: DC | PRN

## 2020-12-23 MED ORDER — CEFAZOLIN SODIUM-DEXTROSE 2-4 GM/100ML-% IV SOLN
2.0000 g | Freq: Once | INTRAVENOUS | Status: AC
  Administered 2020-12-23: 2 g via INTRAVENOUS
  Filled 2020-12-23: qty 100

## 2020-12-23 MED ORDER — DOCUSATE SODIUM 100 MG PO CAPS: 100.0000 mg | ORAL_CAPSULE | Freq: Every day | ORAL | 0 refills | Status: DC | PRN

## 2020-12-23 MED ORDER — PROMETHAZINE HCL 25 MG/ML IJ SOLN: 6.2500 mg | INTRAMUSCULAR | Status: DC | PRN

## 2020-12-23 MED ORDER — GEMCITABINE CHEMO FOR BLADDER INSTILLATION 2000 MG
2000.0000 mg | Freq: Once | INTRAVENOUS | Status: DC
  Filled 2020-12-23: qty 52.6

## 2020-12-23 MED ORDER — ACETAMINOPHEN 10 MG/ML IV SOLN
INTRAVENOUS | Status: AC
  Filled 2020-12-23: qty 100

## 2020-12-23 SURGICAL SUPPLY — 18 items
BAG URINE DRAIN 2000ML AR STRL (UROLOGICAL SUPPLIES) IMPLANT
BAG URO CATCHER STRL LF (MISCELLANEOUS) ×2 IMPLANT
CATH FOLEY 2WAY SLVR  5CC 16FR (CATHETERS) ×1
CATH FOLEY 2WAY SLVR 5CC 16FR (CATHETERS) ×1 IMPLANT
DRAPE FOOT SWITCH (DRAPES) ×2 IMPLANT
ELECT REM PT RETURN 15FT ADLT (MISCELLANEOUS) ×2 IMPLANT
EVACUATOR MICROVAS BLADDER (UROLOGICAL SUPPLIES) IMPLANT
GLOVE SURG ENC TEXT LTX SZ7.5 (GLOVE) ×2 IMPLANT
GOWN STRL REUS W/TWL LRG LVL3 (GOWN DISPOSABLE) ×2 IMPLANT
KIT TURNOVER KIT A (KITS) IMPLANT
LOOP CUT BIPOLAR 24F LRG (ELECTROSURGICAL) ×2 IMPLANT
MANIFOLD NEPTUNE II (INSTRUMENTS) ×2 IMPLANT
PACK CYSTO (CUSTOM PROCEDURE TRAY) ×2 IMPLANT
PENCIL SMOKE EVACUATOR (MISCELLANEOUS) IMPLANT
SYR TOOMEY IRRIG 70ML (MISCELLANEOUS)
SYRINGE TOOMEY IRRIG 70ML (MISCELLANEOUS) IMPLANT
TUBING CONNECTING 10 (TUBING) ×2 IMPLANT
TUBING UROLOGY SET (TUBING) ×2 IMPLANT

## 2020-12-23 NOTE — Transfer of Care (Signed)
Immediate Anesthesia Transfer of Care Note  Patient: Douglas Holmes  Procedure(s) Performed: Procedure(s): TRANSURETHRAL RESECTION OF BLADDER TUMOR (TURBT)/ BLADDER BIOPSY (N/A)  Patient Location: PACU  Anesthesia Type:General  Level of Consciousness:  sedated, patient cooperative and responds to stimulation  Airway & Oxygen Therapy:Patient Spontanous Breathing and Patient connected to face mask oxgen  Post-op Assessment:  Report given to PACU RN and Post -op Vital signs reviewed and stable  Post vital signs:  Reviewed and stable  Last Vitals:  Vitals:   12/23/20 1155 12/23/20 1200  BP:  106/72  Pulse: 64 62  Resp: 13 13  Temp:    SpO2: 57% 32%    Complications: No apparent anesthesia complications

## 2020-12-23 NOTE — Op Note (Signed)
Operative Note  Preoperative diagnosis:  1.  Low risk non-muscle invasive bladder cancer:  Postoperative diagnosis: 1.  Low risk non-muscle invasive bladder cancer:  Procedure(s): 1.  Bladder biopsy 2. TURBT small  Surgeon: Rexene Alberts, MD  Assistants:  None  Anesthesia:  General  Complications:  None  EBL:  Minimal  Specimens: 1.  ID Type Source Tests Collected by Time Destination  1 : bladder dome bladder tumor Tissue PATH GU resection / TURBT / partial nephrectomy SURGICAL PATHOLOGY Janith Lima, MD 12/23/2020 1125    Drains/Catheters: 1.  16Fr foley catheter  Intraoperative findings:   1cm papillary tumor on the left anterior proximal dome of the bladder. Biopsied x2 in its entirety and deeply resected. Muscle at base of specimen. Excellent hemostasis  Number of tumors: 1 Size of largest tumor:       1cm  Characteristics of tumors:     Papillary   Recurrent  Yes  Suspicious for Carcinoma in situ: No  Clinical tumor stage:         cTa      Bimanual exam under anesthesia:       Yes - no evidence of 3 dimensional mass  Visually complete resection:                Yes  Visualization of detrusor muscle in resection base:     Yes  Visual evaluation for perforation:           Performed - no evidence of perforation   Indication:  Douglas Holmes is a 75 y.o. male with history of low risk nonmuscle invasive bladder cancer.  He had a resection.  2014 and 2015 that resulted low-grade TA UCB.  He had a TURBT in 01/2020 that revealed urothelium with hyperplasia and dysplasia.  There is no evidence of CIS.  Cystoscopy 11/2020 with a small subcentimeter papillary lesion at the dome of the bladder.  All the risks, benefits were discussed with the patient to include but not limited to infection, pain, bleeding, damage to adjacent structures, need for further operations, adverse reaction to anesthesia and death.  Patient understands these risks and agrees to proceed with the  operation as planned.    Description of procedure: After informed consent was obtained from the patient, the patient was taken to the operating room. General anesthesia was administered. The patient was placed in dorsal lithotomy position and prepped and draped in usual sterile fashion. Sequential compression devices were applied to lower extremities at the beginning of the case for DVT prophylaxis. Antibiotics were infused prior to surgery start time. A surgical time-out was performed to properly identify the patient, the surgery to be performed, and the surgical site.     We then passed the 21-French rigid cystoscope down the urethra and into the bladder under direct vision without any difficulty. The anterior urethral was normal.  He did have evidence of regrowth of bilateral adenoma of his prostate with of the apex and bladder neck.  Prostate was however nonobstructing. The bladder was inspected with 30 and 70 degree lenses. Once in the bladder, systematic evaluation of bladder revealed a 1 cm papillary lesion at the proximal left anterior aspect of the dome. The ureteral orfices were in orthotopic position and not involved.  Using a rigid forcep biopsy, this mass was biopsied in its entirety with 2 separate biopsies.  I do see evidence of muscle at the base of this biopsy.   We then removed the cystoscope and then passed  down the 26 French resectoscope sheath down the urethra into the bladder under direct vision with the visual obturator. The tumor was resected down to muscle. The TUR bladder tumor chips were retrieved from the bladder and each region of resection was passed off the field as a specimen.  Hemostasis was achieved using electrocautery. We then proceeded with removing the resectoscope and then placed in a 16Foley catheter given the deep resection.  The patient tolerated the procedure well with no complication and was awoken from anesthesia and taken to recovery in stable condition.     Plan: We will leave Foley catheter in place for 3 days given the deep resection.  He will remove Foley catheter at home and provided syringe.  He will follow-up with me next week to review pathology.  Matt R. [facility]  Pager: [phone]

## 2020-12-23 NOTE — Anesthesia Preprocedure Evaluation (Addendum)
Anesthesia Evaluation  Patient identified by MRN, date of birth, ID band Patient awake    Reviewed: Allergy & Precautions, NPO status , Patient's Chart, lab work & pertinent test results  Airway Mallampati: II  TM Distance: >3 FB Neck ROM: Full    Dental  (+) Teeth Intact, Dental Advisory Given   Pulmonary former smoker,    breath sounds clear to auscultation       Cardiovascular negative cardio ROS   Rhythm:Regular Rate:Normal     Neuro/Psych negative neurological ROS  negative psych ROS   GI/Hepatic negative GI ROS, Neg liver ROS,   Endo/Other  negative endocrine ROS  Renal/GU negative Renal ROS     Musculoskeletal  (+) Arthritis ,   Abdominal Normal abdominal exam  (+)   Peds  Hematology negative hematology ROS (+)   Anesthesia Other Findings   Reproductive/Obstetrics                            Anesthesia Physical Anesthesia Plan  ASA: 2  Anesthesia Plan: General   Post-op Pain Management:    Induction: Intravenous  PONV Risk Score and Plan: 3 and Ondansetron, Midazolam and Dexamethasone  Airway Management Planned: LMA  Additional Equipment: None  Intra-op Plan:   Post-operative Plan: Extubation in OR  Informed Consent: I have reviewed the patients History and Physical, chart, labs and discussed the procedure including the risks, benefits and alternatives for the proposed anesthesia with the patient or authorized representative who has indicated his/her understanding and acceptance.     Dental advisory given  Plan Discussed with: CRNA  Anesthesia Plan Comments:        Anesthesia Quick Evaluation

## 2020-12-23 NOTE — Anesthesia Postprocedure Evaluation (Signed)
Anesthesia Post Note  Patient: Douglas Holmes  Procedure(s) Performed: TRANSURETHRAL RESECTION OF BLADDER TUMOR (TURBT)/ BLADDER BIOPSY (Bladder)     Patient location during evaluation: PACU Anesthesia Type: General Level of consciousness: awake and alert Pain management: pain level controlled Vital Signs Assessment: post-procedure vital signs reviewed and stable Respiratory status: spontaneous breathing, nonlabored ventilation, respiratory function stable and patient connected to nasal cannula oxygen Cardiovascular status: blood pressure returned to baseline and stable Postop Assessment: no apparent nausea or vomiting Anesthetic complications: no   No notable events documented.  Last Vitals:  Vitals:   12/23/20 1215 12/23/20 1230  BP: 115/77 (!) 129/91  Pulse: 62 62  Resp: 14 16  Temp:    SpO2: 99% 98%    Last Pain:  Vitals:   12/23/20 1230  TempSrc:   PainSc: 0-No pain                 Effie Berkshire

## 2020-12-23 NOTE — Discharge Instructions (Addendum)
Activity:  You are encouraged to ambulate frequently (about every hour during waking hours) to help prevent blood clots from forming in your legs or lungs.    Diet: You should advance your diet as instructed by your physician.  It will be normal to have some bloating, nausea, and abdominal discomfort intermittently.  Prescriptions:  You will be provided a prescription for pain medication to take as needed.  If your pain is not severe enough to require the prescription pain medication, you may take extra strength Tylenol instead which will have less side effects.  You should also take a prescribed stool softener to avoid straining with bowel movements as the prescription pain medication may constipate you.  What to call us about: You should call the office [phone]) if you develop fever > 101 or develop persistent vomiting. Activity:  You are encouraged to ambulate frequently (about every hour during waking hours) to help prevent blood clots from forming in your legs or lungs.    You have a foley catheter in place. Remove foley at home in 3 days with provided syringe.

## 2020-12-23 NOTE — Anesthesia Procedure Notes (Signed)
Procedure Name: LMA Insertion Date/Time: 12/23/2020 11:13 AM Performed by: Eben Burow, CRNA Pre-anesthesia Checklist: Patient identified, Emergency Drugs available, Suction available, Patient being monitored and Timeout performed Patient Re-evaluated:Patient Re-evaluated prior to induction Oxygen Delivery Method: Circle system utilized Preoxygenation: Pre-oxygenation with 100% oxygen Induction Type: IV induction Ventilation: Mask ventilation without difficulty LMA: LMA inserted LMA Size: 4.0 Number of attempts: 1 Tube secured with: Tape Dental Injury: Teeth and Oropharynx as per pre-operative assessment  Comments: LMA placed by D. Brower SRNA.

## 2020-12-23 NOTE — H&P (Signed)
CC/HPI: Douglas Holmes is a 75 year old male seen in follow-up with bladder cancer.   1. Low risk non-muscle invasive bladder cancer:  He was incidentally noted to have a bladder tumor at the time of his TURP. He underwent a TURP and small TURBT in 05/2012. Pathology revealed LG Ta UCB. He underwent repeat TURBT on 11/20/2013 that revealed LG Ta UCB. He did not undergo BCG. Surveillance cystoscopy 11/19/2019 demonstrated a subcentimeter low-grade papillary lesion on the left posterior aspect of his bladder. TURBT 01/22/2020 revealed urothelium hyperplasia with dysplasia. No evidence of CIS. Cystoscopy 04/27/2020 with no evidence of suspicious bladder lesions.  -Cystoscopy 11/15/2020 with a small subcentimeter papillary lesion at the dome of his bladder.  -He denies gross hematuria. He denies pain or weight loss. He has stable appetite. He has a history of smoking, smoking less than 1/2 pack/day and quit in 2020. He denies a family history of urologic malignancy.   #2. BPH/LUTS: He has stable lower urinary symptoms including sensation of incomplete emptying, frequency, intermittency, weak flow stream, straining to void 1 time nocturia. His IPSS score today is 12, Q OL 2. He does not take an alpha-blocker and is not interested in starting one.   #3. Prostate cancer screening: PSA on 11/08/2020 was 3.71. He denies a family history of prostate cancer. He denies a family history of urologic malignancy. He has never had an elevated PSA. He has never had a TRUS biopsy. DRE in 04/2020 demonstrated 50 g, no nodules.   Patient currently denies fever, chills, sweats, nausea, vomiting, abdominal or flank pain, gross hematuria or dysuria.     ALLERGIES: No Allergies    MEDICATIONS: Aspirin Ec 81 mg tablet, delayed release Oral  Ezetimibe 10 mg tablet  Glucosamine Hcl 1,500 mg tablet  Multivitamins tablet Oral  Omega-3 Krill Oil 1,000 mg-230 mg-60 mg-128 mg-334 mg-50 mcg capsule Oral  Rosuvastatin Calcium 20 mg  tablet  Sildenafil Citrate 20 MG Oral Tablet 0 Oral     GU PSH: Cysto Bladder Ureth Biopsy - 01/22/2020 Cystoscopy - 04/27/2020, 11/19/2019, 11/18/2018, 2019, 2019, 2018, 2018, 2017, 2017 Cystoscopy TURBT <2 cm - 2015 Cystoscopy TURP - 2015 Laser Surgery Prostate - 2015       PSH Notes: Cystoscopy With Fulguration Small Lesion (5-38mm), Laser Coagulation Of Prostate, Transurethral Resection Of Prostate (TURP), Prostate Surgery, Intestinal Surgery, Colotomy   NON-GU PSH: Incise Large Bowel - 2015 Inguinal Hernia Repair < 5 yrs - about 2020 Unlisted Procedure Intestine - 2015     GU PMH: BPH w/o LUTS - 04/27/2020, - 01/29/2020, - 11/19/2019 (Stable), he does have BPH cystoscopically and has changes noted of the bladder wall consistent with outlet obstruction but does not have any voiding symptoms., - 2017 Encounter for Prostate Cancer screening - 04/27/2020, - 01/29/2020, - 11/19/2019, His prostate is noted to be smooth and benign on exam today. His PSA is normal at 3.47., - 2018 History of bladder cancer - 04/27/2020, - 01/29/2020, - 11/19/2019 (Stable), He had no evidence of transitional cell carcinoma within the bladder today and it is now been 4 years since he last had a bladder tumor resected. I told him that we could now proceed with cystoscopies on an annual basis., - 2019 (Stable), He had no evidence of recurrent transitional cell carcinoma of the bladder noted cystoscopically today. He will return in 6 months for repeat surveillance cystoscopy in if things look clear than he will advance to yearly cystoscopies., - 2019 (Stable), He had no evidence of recurrent transitional  cell carcinoma the bladder. He will continue with surveillance cystoscopy again in 6 months., - 2018, He had no evidence of recurrent transitional cell carcinoma of the bladder noted cystoscopically today. I will continue to monitor him every 6 months., - 2018, History of malignant neoplasm of bladder, - 2017 Elevated PSA, Rising  PSA level - 2016 Male ED, unspecified, Erectile dysfunction - 2016 Nocturia, Nocturia - 2015 History of urolithiasis, History of renal calculi - 2015      PMH Notes: History of bladder cancer: He was incidentally noted to have a bladder tumor at the time of the TURP.  TURBT - 5/14. Pathology: Papillary, single (Ta,G1).  Repeat TURBT 11/20/13. Pathology: Papillary, single (Ta,G1)   BPH with outlet obstruction: He was managed initially with maximum medical management using Proscar and Uroxatral. He underwent interstitial laser coagulation in '04. He underwent a TURP in 5/14 which resulted in significant improvement in his voiding symptoms.   History of nephrolithiasis: He has a history of calculus disease and underwent ureteroscopy for a left distal ureteral stone in 3/13. 24-hour urine - elevated urine uric acid, sodium and low volume.   Erectile dysfunction: This has been managed with Viagra 100 mg. using one half a pill.  Current therapy: Sildenafil   Rising PSA: His PSA increased from 0.9 in 2013 to 2.4 in 1/15. He was noted at that time to have no abnormality on DRE.   4/16 - 2.67     NON-GU PMH: Encounter for general adult medical examination without abnormal findings, Encounter for preventive health examination - 2015 Personal history of other endocrine, nutritional and metabolic disease, History of hypercholesterolemia - 2015    FAMILY HISTORY: CAD (coronary artery disease) - Runs In Family Deceased - Runs In Family malignant neoplasm - Runs In Family   SOCIAL HISTORY: Marital Status: Married Preferred Language: English; Race: White Current Smoking Status: Patient does not smoke anymore. Has not smoked since 08/18/2018. Smoked less than 1/2 pack per day.  Does not use smokeless tobacco. Social Drinker.  Does not use drugs. Drinks 2 caffeinated drinks per day. Has not had a blood transfusion.     Notes: Former smoker, Caffeine use, Alcohol use, Retired, Married, Number of  children   REVIEW OF SYSTEMS:    GU Review Male:   Patient denies frequent urination, hard to postpone urination, burning/ pain with urination, get up at night to urinate, leakage of urine, stream starts and stops, trouble starting your stream, have to strain to urinate , erection problems, and penile pain.  Gastrointestinal (Upper):   Patient denies nausea, vomiting, and indigestion/ heartburn.  Gastrointestinal (Lower):   Patient denies diarrhea and constipation.  Constitutional:   Patient denies fever, night sweats, weight loss, and fatigue.  Skin:   Patient denies skin rash/ lesion and itching.  Eyes:   Patient denies double vision and blurred vision.  Ears/ Nose/ Throat:   Patient denies sore throat and sinus problems.  Hematologic/Lymphatic:   Patient denies swollen glands and easy bruising.  Cardiovascular:   Patient denies leg swelling and chest pains.  Respiratory:   Patient denies cough and shortness of breath.  Endocrine:   Patient denies excessive thirst.  Musculoskeletal:   Patient denies back pain and joint pain.  Neurological:   Patient denies headaches and dizziness.  Psychologic:   Patient denies depression and anxiety.   VITAL SIGNS: None   MULTI-SYSTEM PHYSICAL EXAMINATION:    Constitutional: Well-nourished. No physical deformities. Normally developed. Good grooming.  Respiratory: No labored  breathing, no use of accessory muscles.   Cardiovascular: Normal temperature, normal extremity pulses, no swelling, no varicosities.  Gastrointestinal: No mass, no tenderness, no rigidity, non obese abdomen.     Complexity of Data:  Source Of History:  Patient, Medical Record Summary  Lab Test Review:   PSA  Records Review:   Pathology Reports, Previous Doctor Records, Previous Hospital Records  Urine Test Review:   Urinalysis   11/08/20 11/12/19 11/11/18 11/14/17 11/04/17 10/23/16 11/22/14 05/13/14  PSA  Total PSA 3.71 ng/mL 3.14 ng/mL 3.17 ng/mL 2.68 ng/mL 2.79 ng/mL 3.47  ng/dl 3.05 ng/dl 2.67     PROCEDURES:         Flexible Cystoscopy - 52000  Risks, benefits, and some of the potential complications of the procedure were discussed at length with the patient including infection, bleeding, voiding discomfort, urinary retention, fever, chills, sepsis, and others. All questions were answered. Informed consent was obtained. Antibiotic prophylaxis was given. Sterile technique and intraurethral analgesia were used.  Meatus:  Normal size. Normal location. Normal condition.  Urethra:  No strictures.  External Sphincter:  Normal.  Verumontanum:  Normal.  Prostate:  Moderate hyperplasia. Non-obstructing.  Bladder Neck:  Non-obstructing.  Ureteral Orifices:  Normal location. Normal size. Normal shape. Effluxed clear urine.  Bladder:  No trabeculation. Subcentimeter papillary lesion at the dome of his bladder      The lower urinary tract was carefully examined. The procedure was well-tolerated and without complications. Antibiotic instructions were given. Instructions were given to call the office immediately for bloody urine, difficulty urinating, urinary retention, painful or frequent urination, fever, chills, nausea, vomiting or other illness. The patient stated that he understood these instructions and would comply with them.   ASSESSMENT:      ICD-10 Details  1 GU:   BPH w/o LUTS - N40.0   2   Encounter for Prostate Cancer screening - Z12.5   3   History of bladder cancer - Z85.51    PLAN:           Orders Labs Urine Cytology          Document Letter(s):  Created for Patient: Clinical Summary         Notes:   1. Low risk non-muscle invasive bladder cancer:  TURBT hx:  -05/2012: LG Ta UCB  -11/20/2013 LG Ta UCB  -01/22/2020 urothelium hyperplasia with dysplasia only. No evidence of CIS. Lesion was in and adjacent to a left sided bladder diverticulum.  -Cystoscopy 11/15/2020 with a subcentimeter papillary lesion at the dome of his bladder  -I recommend  cystoscopy, bladder biopsy, TURBT. Surgery letter sent. Discussed risk and benefits.  -May require updated imaging if pathology consistent with UCB.   #2. Prostate cancer screening: DRE in 04/2020 benign. PSA on 11/09/2018 was 3.71. I recommend annual PSA screening. I will plan to order annual PSA and follow-up after reviewing pathology for TURBT as above.   3. BPH with LUTS: IPSS 12. Does not want to start alpha blocker. Tried this previously with not much benefit. S/p TURP in 2014.   CC: Douglas Pretty, MD   Urology Preoperative H&P   Chief Complaint: Bladder cancer  History of Present Illness: Douglas Holmes is a 75 y.o. male with bladder cancer here for bladder biopsy, TURBT, possible instillation of gemcitabine.    Past Medical History:  Diagnosis Date   Bladder mass    BPH (benign prostatic hypertrophy)    Diverticulosis of colon    ED (erectile dysfunction)  Focal dystonia NEUROLOGIST-  Douglas Holmes   RIGHT HAND AND ARM   Generalized OA    FINGERS   History of bladder cancer urologist--  Douglas Douglas Holmes   05/ 2014  single papillary (Ta G1) s/p TURBT and 11/ 2015 s/p TURBT   History of diverticulitis    History of kidney stones    Hx SBO    2007 s/p  colon resection;  2017 medically management   Hyperlipidemia    Nocturia    Wears glasses    Wears hearing aid in both ears     Past Surgical History:  Procedure Laterality Date   CYSTOSCOPY WITH BIOPSY  01/22/2020   Procedure: CYSTOSCOPY WITH BLADDER  BIOPSY;  Surgeon: Douglas Lima, MD;  Location: [place];  Service: Urology;;   EXPLORATORY LAPAROTOMY W/ BOWEL RESECTION  11/ 2007   SBO   GREEN LIGHT LASER TURP (TRANSURETHRAL RESECTION OF PROSTATE  2004   HAND SURGERY  2016   sutures where pt fell and cut hand   INGUINAL HERNIA REPAIR Right 07/04/2018   Procedure: OPEN RIGHT INGUINAL HERNIA REPAIR WITH MESH;  Surgeon: Douglas Gemma, MD;  Location: [facility];  Service: General;  Laterality: Right;   LEFT  URETEROSCOPIC STONE EXTRACTION  3/ 2013   PARTIAL COLECTOMY  1988   diverticulitis   TRANSURETHRAL RESECTION OF BLADDER TUMOR N/A 11/20/2013   Procedure: TRANSURETHRAL RESECTION OF BLADDER TUMOR (TURBT);  Surgeon: Douglas Jabs, MD;  Location[facility];  Service: Urology;  Laterality: N/A;   TRANSURETHRAL RESECTION OF PROSTATE  5/ 2014   and Resection Bladder Tumor    Allergies: No Known Allergies  Family History  Problem Relation Holmes of Onset   Dementia Father    Cancer Mother    Hypertension Mother    Colon cancer Other        Family hx   Heart failure Brother    Coronary artery disease Brother     Social History:  reports that he quit smoking about 27 years ago. His smoking use included cigarettes. He has a 30.00 pack-year smoking history. He has never used smokeless tobacco. He reports current alcohol use of about 3.0 standard drinks per week. He reports that he does not use drugs.  ROS: A complete review of systems was performed.  All systems are negative except for pertinent findings as noted.  Physical Exam:  Vital signs in last 24 hours: Temp:  [97.6 F (36.4 C)] 97.6 F (36.4 C) (12/09 1007) Pulse Rate:  [89] 89 (12/09 1007) Resp:  [18] 18 (12/09 1007) BP: (134)/(89) 134/89 (12/09 1007) SpO2:  [95 %] 95 % (12/09 1007) Weight:  [100.2 kg] 100.2 kg (12/09 1016) Constitutional:  Alert and oriented, No acute distress Cardiovascular: Regular rate and rhythm Respiratory: Normal respiratory effort, Lungs clear bilaterally GI: Abdomen is soft, nontender, nondistended, no abdominal masses GU: No CVA tenderness Lymphatic: No lymphadenopathy Neurologic: Grossly intact, no focal deficits Psychiatric: Normal mood and affect  Laboratory Data:  No results for input(s): WBC, HGB, HCT, PLT in the last 72 hours.  No results for input(s): NA, K, CL, GLUCOSE, BUN, CALCIUM, CREATININE in the last 72 hours.  Invalid input(s): CO3   No results found for this  or any previous visit (from the past 24 hour(s)). No results found for this or any previous visit (from the past 240 hour(s)).  Renal Function: No results for input(s): CREATININE in the last 168 hours. CrCl cannot  be calculated (Patient's most recent lab result is older than the maximum 21 days allowed.).  Radiologic Imaging: No results found.  I independently reviewed the above imaging studies.  Assessment and Plan Douglas Holmes is a 75 y.o. male with bladder biopsy, TURBT, possible instillation of gemcitabine.  Douglas R. Elcie Pelster MD 12/23/2020, 10:40 AM  [facility] Pager: [phone]): [phone]

## 2020-12-24 ENCOUNTER — Encounter (HOSPITAL_COMMUNITY): Payer: Self-pay | Admitting: Urology

## 2020-12-26 LAB — SURGICAL PATHOLOGY

## 2020-12-29 DIAGNOSIS — C673 Malignant neoplasm of anterior wall of bladder: Secondary | ICD-10-CM | POA: Diagnosis not present

## 2021-01-06 DIAGNOSIS — C679 Malignant neoplasm of bladder, unspecified: Secondary | ICD-10-CM | POA: Diagnosis not present

## 2021-01-06 DIAGNOSIS — N281 Cyst of kidney, acquired: Secondary | ICD-10-CM | POA: Diagnosis not present

## 2021-01-06 DIAGNOSIS — C673 Malignant neoplasm of anterior wall of bladder: Secondary | ICD-10-CM | POA: Diagnosis not present

## 2021-01-06 DIAGNOSIS — N200 Calculus of kidney: Secondary | ICD-10-CM | POA: Diagnosis not present

## 2021-01-06 DIAGNOSIS — K5730 Diverticulosis of large intestine without perforation or abscess without bleeding: Secondary | ICD-10-CM | POA: Diagnosis not present

## 2021-03-22 DIAGNOSIS — E669 Obesity, unspecified: Secondary | ICD-10-CM | POA: Diagnosis not present

## 2021-03-22 DIAGNOSIS — E785 Hyperlipidemia, unspecified: Secondary | ICD-10-CM | POA: Diagnosis not present

## 2021-03-22 DIAGNOSIS — Z825 Family history of asthma and other chronic lower respiratory diseases: Secondary | ICD-10-CM | POA: Diagnosis not present

## 2021-03-22 DIAGNOSIS — Z87891 Personal history of nicotine dependence: Secondary | ICD-10-CM | POA: Diagnosis not present

## 2021-03-22 DIAGNOSIS — Z6830 Body mass index (BMI) 30.0-30.9, adult: Secondary | ICD-10-CM | POA: Diagnosis not present

## 2021-03-22 DIAGNOSIS — Z809 Family history of malignant neoplasm, unspecified: Secondary | ICD-10-CM | POA: Diagnosis not present

## 2021-03-22 DIAGNOSIS — Z7982 Long term (current) use of aspirin: Secondary | ICD-10-CM | POA: Diagnosis not present

## 2021-03-22 DIAGNOSIS — Z8551 Personal history of malignant neoplasm of bladder: Secondary | ICD-10-CM | POA: Diagnosis not present

## 2021-03-22 DIAGNOSIS — Z8249 Family history of ischemic heart disease and other diseases of the circulatory system: Secondary | ICD-10-CM | POA: Diagnosis not present

## 2021-03-22 DIAGNOSIS — Z008 Encounter for other general examination: Secondary | ICD-10-CM | POA: Diagnosis not present

## 2021-03-22 DIAGNOSIS — N529 Male erectile dysfunction, unspecified: Secondary | ICD-10-CM | POA: Diagnosis not present

## 2021-03-29 DIAGNOSIS — C673 Malignant neoplasm of anterior wall of bladder: Secondary | ICD-10-CM | POA: Diagnosis not present

## 2021-04-20 DIAGNOSIS — I2510 Atherosclerotic heart disease of native coronary artery without angina pectoris: Secondary | ICD-10-CM | POA: Diagnosis not present

## 2021-04-20 DIAGNOSIS — Z7982 Long term (current) use of aspirin: Secondary | ICD-10-CM | POA: Diagnosis not present

## 2021-04-20 DIAGNOSIS — E7801 Familial hypercholesterolemia: Secondary | ICD-10-CM | POA: Diagnosis not present

## 2021-04-20 DIAGNOSIS — Z125 Encounter for screening for malignant neoplasm of prostate: Secondary | ICD-10-CM | POA: Diagnosis not present

## 2021-04-24 DIAGNOSIS — R972 Elevated prostate specific antigen [PSA]: Secondary | ICD-10-CM | POA: Diagnosis not present

## 2021-04-24 DIAGNOSIS — I2510 Atherosclerotic heart disease of native coronary artery without angina pectoris: Secondary | ICD-10-CM | POA: Diagnosis not present

## 2021-04-24 DIAGNOSIS — N200 Calculus of kidney: Secondary | ICD-10-CM | POA: Diagnosis not present

## 2021-04-24 DIAGNOSIS — Z7184 Encounter for health counseling related to travel: Secondary | ICD-10-CM | POA: Diagnosis not present

## 2021-04-24 DIAGNOSIS — Z8551 Personal history of malignant neoplasm of bladder: Secondary | ICD-10-CM | POA: Diagnosis not present

## 2021-04-24 DIAGNOSIS — Z0000 Encounter for general adult medical examination without abnormal findings: Secondary | ICD-10-CM | POA: Diagnosis not present

## 2021-06-08 DIAGNOSIS — H524 Presbyopia: Secondary | ICD-10-CM | POA: Diagnosis not present

## 2021-06-08 DIAGNOSIS — H2513 Age-related nuclear cataract, bilateral: Secondary | ICD-10-CM | POA: Diagnosis not present

## 2021-06-08 DIAGNOSIS — H5203 Hypermetropia, bilateral: Secondary | ICD-10-CM | POA: Diagnosis not present

## 2021-06-13 DIAGNOSIS — Z125 Encounter for screening for malignant neoplasm of prostate: Secondary | ICD-10-CM | POA: Diagnosis not present

## 2021-06-13 DIAGNOSIS — R972 Elevated prostate specific antigen [PSA]: Secondary | ICD-10-CM | POA: Diagnosis not present

## 2021-06-28 DIAGNOSIS — D225 Melanocytic nevi of trunk: Secondary | ICD-10-CM | POA: Diagnosis not present

## 2021-06-28 DIAGNOSIS — L821 Other seborrheic keratosis: Secondary | ICD-10-CM | POA: Diagnosis not present

## 2021-06-28 DIAGNOSIS — D2271 Melanocytic nevi of right lower limb, including hip: Secondary | ICD-10-CM | POA: Diagnosis not present

## 2021-06-28 DIAGNOSIS — D1801 Hemangioma of skin and subcutaneous tissue: Secondary | ICD-10-CM | POA: Diagnosis not present

## 2021-06-28 DIAGNOSIS — L814 Other melanin hyperpigmentation: Secondary | ICD-10-CM | POA: Diagnosis not present

## 2021-12-20 DIAGNOSIS — Z125 Encounter for screening for malignant neoplasm of prostate: Secondary | ICD-10-CM | POA: Diagnosis not present

## 2021-12-27 DIAGNOSIS — N400 Enlarged prostate without lower urinary tract symptoms: Secondary | ICD-10-CM | POA: Diagnosis not present

## 2021-12-27 DIAGNOSIS — C673 Malignant neoplasm of anterior wall of bladder: Secondary | ICD-10-CM | POA: Diagnosis not present

## 2022-02-27 DIAGNOSIS — T753XXD Motion sickness, subsequent encounter: Secondary | ICD-10-CM | POA: Diagnosis not present

## 2022-02-27 DIAGNOSIS — H938X2 Other specified disorders of left ear: Secondary | ICD-10-CM | POA: Diagnosis not present

## 2022-05-24 DIAGNOSIS — E7800 Pure hypercholesterolemia, unspecified: Secondary | ICD-10-CM | POA: Diagnosis not present

## 2022-05-24 DIAGNOSIS — Z125 Encounter for screening for malignant neoplasm of prostate: Secondary | ICD-10-CM | POA: Diagnosis not present

## 2022-05-31 DIAGNOSIS — N200 Calculus of kidney: Secondary | ICD-10-CM | POA: Diagnosis not present

## 2022-05-31 DIAGNOSIS — N529 Male erectile dysfunction, unspecified: Secondary | ICD-10-CM | POA: Diagnosis not present

## 2022-05-31 DIAGNOSIS — C673 Malignant neoplasm of anterior wall of bladder: Secondary | ICD-10-CM | POA: Diagnosis not present

## 2022-05-31 DIAGNOSIS — Z23 Encounter for immunization: Secondary | ICD-10-CM | POA: Diagnosis not present

## 2022-05-31 DIAGNOSIS — Z0000 Encounter for general adult medical examination without abnormal findings: Secondary | ICD-10-CM | POA: Diagnosis not present

## 2022-05-31 DIAGNOSIS — I2510 Atherosclerotic heart disease of native coronary artery without angina pectoris: Secondary | ICD-10-CM | POA: Diagnosis not present

## 2022-05-31 DIAGNOSIS — H938X2 Other specified disorders of left ear: Secondary | ICD-10-CM | POA: Diagnosis not present

## 2022-06-20 DIAGNOSIS — H5203 Hypermetropia, bilateral: Secondary | ICD-10-CM | POA: Diagnosis not present

## 2022-06-20 DIAGNOSIS — H2513 Age-related nuclear cataract, bilateral: Secondary | ICD-10-CM | POA: Diagnosis not present

## 2022-07-03 DIAGNOSIS — L821 Other seborrheic keratosis: Secondary | ICD-10-CM | POA: Diagnosis not present

## 2022-07-03 DIAGNOSIS — D485 Neoplasm of uncertain behavior of skin: Secondary | ICD-10-CM | POA: Diagnosis not present

## 2022-07-03 DIAGNOSIS — L820 Inflamed seborrheic keratosis: Secondary | ICD-10-CM | POA: Diagnosis not present

## 2022-07-17 ENCOUNTER — Ambulatory Visit (HOSPITAL_BASED_OUTPATIENT_CLINIC_OR_DEPARTMENT_OTHER)
Admission: RE | Admit: 2022-07-17 | Discharge: 2022-07-17 | Disposition: A | Discharge status: Home / Self Care | Payer: Medicare HMO | Source: Ambulatory Visit

## 2022-07-17 ENCOUNTER — Other Ambulatory Visit (HOSPITAL_COMMUNITY): Payer: Self-pay

## 2022-07-17 DIAGNOSIS — R109 Unspecified abdominal pain: Secondary | ICD-10-CM | POA: Diagnosis present

## 2022-07-17 DIAGNOSIS — R110 Nausea: Secondary | ICD-10-CM | POA: Insufficient documentation

## 2022-07-17 DIAGNOSIS — N400 Enlarged prostate without lower urinary tract symptoms: Secondary | ICD-10-CM | POA: Diagnosis not present

## 2022-07-17 DIAGNOSIS — K5730 Diverticulosis of large intestine without perforation or abscess without bleeding: Secondary | ICD-10-CM | POA: Diagnosis not present

## 2022-07-17 DIAGNOSIS — N200 Calculus of kidney: Secondary | ICD-10-CM | POA: Diagnosis not present

## 2022-07-23 DIAGNOSIS — Z87891 Personal history of nicotine dependence: Secondary | ICD-10-CM | POA: Diagnosis not present

## 2022-07-23 DIAGNOSIS — K056 Periodontal disease, unspecified: Secondary | ICD-10-CM | POA: Diagnosis not present

## 2022-07-23 DIAGNOSIS — N181 Chronic kidney disease, stage 1: Secondary | ICD-10-CM | POA: Diagnosis not present

## 2022-07-23 DIAGNOSIS — Z820 Family history of epilepsy and other diseases of the nervous system: Secondary | ICD-10-CM | POA: Diagnosis not present

## 2022-07-23 DIAGNOSIS — Z8249 Family history of ischemic heart disease and other diseases of the circulatory system: Secondary | ICD-10-CM | POA: Diagnosis not present

## 2022-07-23 DIAGNOSIS — Z8551 Personal history of malignant neoplasm of bladder: Secondary | ICD-10-CM | POA: Diagnosis not present

## 2022-07-23 DIAGNOSIS — Z818 Family history of other mental and behavioral disorders: Secondary | ICD-10-CM | POA: Diagnosis not present

## 2022-07-23 DIAGNOSIS — H269 Unspecified cataract: Secondary | ICD-10-CM | POA: Diagnosis not present

## 2022-07-23 DIAGNOSIS — E785 Hyperlipidemia, unspecified: Secondary | ICD-10-CM | POA: Diagnosis not present

## 2022-07-23 DIAGNOSIS — R32 Unspecified urinary incontinence: Secondary | ICD-10-CM | POA: Diagnosis not present

## 2022-07-23 DIAGNOSIS — M1990 Unspecified osteoarthritis, unspecified site: Secondary | ICD-10-CM | POA: Diagnosis not present

## 2022-07-23 DIAGNOSIS — N529 Male erectile dysfunction, unspecified: Secondary | ICD-10-CM | POA: Diagnosis not present

## 2022-10-25 DIAGNOSIS — M549 Dorsalgia, unspecified: Secondary | ICD-10-CM | POA: Diagnosis not present

## 2022-10-25 DIAGNOSIS — N200 Calculus of kidney: Secondary | ICD-10-CM | POA: Diagnosis not present

## 2022-10-25 DIAGNOSIS — Z23 Encounter for immunization: Secondary | ICD-10-CM | POA: Diagnosis not present

## 2022-10-26 ENCOUNTER — Other Ambulatory Visit (HOSPITAL_COMMUNITY): Payer: Self-pay | Admitting: Internal Medicine

## 2022-10-26 ENCOUNTER — Ambulatory Visit (HOSPITAL_COMMUNITY)
Admission: RE | Admit: 2022-10-26 | Discharge: 2022-10-26 | Disposition: A | Discharge status: Home / Self Care | Payer: Medicare HMO | Source: Ambulatory Visit | Attending: Internal Medicine | Admitting: Internal Medicine

## 2022-10-26 DIAGNOSIS — M549 Dorsalgia, unspecified: Secondary | ICD-10-CM | POA: Diagnosis not present

## 2022-10-26 DIAGNOSIS — N400 Enlarged prostate without lower urinary tract symptoms: Secondary | ICD-10-CM | POA: Diagnosis not present

## 2022-10-26 DIAGNOSIS — N200 Calculus of kidney: Secondary | ICD-10-CM

## 2022-10-26 DIAGNOSIS — K4090 Unilateral inguinal hernia, without obstruction or gangrene, not specified as recurrent: Secondary | ICD-10-CM | POA: Diagnosis not present

## 2022-10-26 DIAGNOSIS — K5730 Diverticulosis of large intestine without perforation or abscess without bleeding: Secondary | ICD-10-CM | POA: Diagnosis not present

## 2022-10-30 ENCOUNTER — Encounter: Payer: Self-pay | Admitting: Cardiology

## 2022-10-30 ENCOUNTER — Ambulatory Visit: Payer: Medicare HMO | Attending: Cardiology | Admitting: Cardiology

## 2022-10-30 VITALS — BP 108/70 | HR 88 | Ht 73.0 in | Wt 233.2 lb

## 2022-10-30 DIAGNOSIS — Z136 Encounter for screening for cardiovascular disorders: Secondary | ICD-10-CM | POA: Diagnosis not present

## 2022-10-30 DIAGNOSIS — I493 Ventricular premature depolarization: Secondary | ICD-10-CM

## 2022-10-30 DIAGNOSIS — I2510 Atherosclerotic heart disease of native coronary artery without angina pectoris: Secondary | ICD-10-CM | POA: Diagnosis not present

## 2022-10-30 DIAGNOSIS — E785 Hyperlipidemia, unspecified: Secondary | ICD-10-CM | POA: Insufficient documentation

## 2022-10-30 DIAGNOSIS — I700 Atherosclerosis of aorta: Secondary | ICD-10-CM | POA: Insufficient documentation

## 2022-10-30 NOTE — Progress Notes (Unsigned)
Cardiology Office Note:  .   Date:  11/01/2022  ID:  Douglas Holmes, DOB 05-Aug-1945, MRN [id number] PCP: Merri Brunette, MD  [facility] Providers Cardiologist:  Bryan Lemma, MD     Chief Complaint  Patient presents with   New Patient (Initial Visit)    Evaluation of aortic and coronary artery atherosclerosis on CT scan    Patient Profile: .     Douglas Holmes is a  77 y.o. male (former smoker-distant) with a PMH notable for HLD, borderline HTN who presents here for Coronary Artery Calcification at the request of Merri Brunette, MD.    Douglas Holmes was seen at [facility] by Evans Lance, NP on 7-24 for right abdominal pain with nausea.  Started on tamsulosin and referred for CT of the abdomen pelvis revealing aortic atherosclerosis.  Based on this results, was referred by Dr. Renne Crigler for cardiology evaluation.  Subjective  Discussed the use of AI scribe software for clinical note transcription with the patient, who gave verbal consent to proceed.  History of Present Illness   The patient, a 77 year old with a history of bladder cancer and atherosclerosis, was referred for a consultation due to concerns about coronary and aortic atherosclerosis identified on a CT scan performed for a kidney stone. The patient was unaware of these findings until recently. The patient has a history of high cholesterol, which has been managed with rosuvastatin and Zetia. The patient's LDL levels have been generally well-controlled, with a recent increase possibly attributed to a [place] vacation. The patient denies any symptoms of chest tightness, pressure, or pain, and reports no shortness of breath, either at rest or during exertion. The patient also denies any symptoms of heart racing, skipping, or flip-flopping.  The patient has a family history of heart disease, with a brother who died from a heart attack at around 36-69 years of age. The patient's brother was a  smoker, but the patient does not smoke. The patient's mother had high blood pressure, but the patient's blood pressure is well-controlled without medication.  The patient has been relatively inactive, with most physical activity occurring during vacations. The patient denies any muscle aches or cramping associated with rosuvastatin use. The patient is also on aspirin for prevention due to the presence of atherosclerotic plaque.  The patient has a history of dizziness upon standing, which has been present since his teenage years. The patient also reports occasional discomfort when sleeping on one side for an extended period. The patient denies any recent illnesses, fevers, or chills. The patient's brother had Crohn's disease, which eventually led to the development of cancer, possibly due to treatment with Humira.  The patient is planning a vacation to [place] in November and is considering increasing his level of physical activity. The patient denies any symptoms of vascular disease, such as pain in the calves or thighs when walking, swelling in the legs, or shortness of breath when lying flat.      Cardiovascular ROS: positive for - dyspnea on exertion and this is really related to deconditioning as he has become quite sedentary over the last several years.  Notes chronic orthostatic dizziness. negative for - edema, irregular heartbeat, orthopnea, palpitations, paroxysmal nocturnal dyspnea, rapid heart rate, shortness of breath, or syncope or near syncope, TIA or emesis fugax, claudication  ROS:  Review of Systems - Negative except abdominal pain noted a couple months ago, but seems to be relatively stable now.  Has not had nephrolithiasis cholelithiasis symptoms.  Objective   PMH: HLD, aortic atherosclerosis with AAA scan negative in January 2023; history of diverticulitis and adenomatous colon polyps with family history of colon cancer-s/p colectomy for diverticulitis.  Nephrolithiasis,  osteoarthritis, hard of hearing, BPH with prostate surgery.  Also history of bladder cancer.  PSH: Prostate surgery, colectomy  Medications - Rosuvastatin 20 mg daily - Zetia 10 mg daily - - Aspirin 81 mg daily - - Osteo Bi-FLex 1 tab daily - - Tamsulosin 0.4 mg daily -  Studies Reviewed: Marland Kitchen   EKG Interpretation Date/Time:  Tuesday October 30 2022 10:44:34 EDT Ventricular Rate:  85 PR Interval:  166 QRS Duration:  114 QT Interval:  400 QTC Calculation: 476 R Axis:   69  Text Interpretation: Sinus rhythm with occasional Premature ventricular complexes Cannot rule out Anterior infarct , age undetermined When compared with ECG of 05-Jul-2015 21:06, Premature ventricular complexes present Confirmed by Bryan Lemma ([id number]) on 10/30/2022 11:07:06 AM     LABS  05/2022: Total cholesterol: 164 ; LDL: 89; Triglycerides: 90; HDL: 58 04/20/2021: TC 149, TG 72, HDL 61, LDL 74 06/03/2020: TC 143, TG 74, HDL 64, LDL 63. 07/17/2022: MVH-Q4.6, H/H16.7/50.3, PLT 267; BUN 15, Cr 0.93, NA 143, K+ 4.6, Glu 146 normal LFTs.  RADIOLOGY CT abdomen pelvis 07/05/2015: Aortic atherosclerosis and coronary atherosclerosis  CT abdomen pelvis 10/26/2022: Negative for hydronephrosis or ureteral stone.  Multiple left kidney stones.  Diverticular disease of colon without inflammatory process.  Enlarged prostate.  Aortic atherosclerosis.  DIAGNOSTIC-Per report Echocardiogram: Normal (1989) Angiogram: Normal (1989)  Risk Assessment/Calculations:          Physical Exam:   VS:  BP 108/70 (BP Location: Left Arm, Patient Position: Sitting, Cuff Size: Normal)   Pulse 88   Ht 6\' 1"  (1.854 m)   Wt 233 lb 3.2 oz (105.8 kg)   SpO2 96%   BMI 30.77 kg/m    Wt Readings from Last 3 Encounters:  10/30/22 233 lb 3.2 oz (105.8 kg)  12/23/20 221 lb (100.2 kg)  12/01/20 228 lb (103.4 kg)    GEN: Well nourished, well developed in no acute distress; healthy appearing; well-groomed.  Healthy-appearing. NECK: No JVD; No  carotid bruits CARDIAC: Normal S1, S2; RRR-occasional ectopy, no murmurs, rubs, gallops RESPIRATORY:  Clear to auscultation without rales, wheezing or rhonchi ; nonlabored, good air movement. ABDOMEN: Soft, non-tender, non-distended EXTREMITIES:  No edema; No deformity     ASSESSMENT AND PLAN: .    Problem List Items Addressed This Visit       Cardiology Problems   Aortic atherosclerosis (HCC) (Chronic)    Chronic aortic and coronary atherosclerosis noted on CT scan from 2017. Discussed the pathophysiology of atherosclerosis and the importance of managing risk factors. LDL has increased from 63 to 89 over the past year. -Continue rosuvastatin and Zetia for cholesterol management. -Check lipid panel in November 2024. -Consider increasing frequency of cholesterol checks to every 6 months due to recent increase in LDL. -Schedule VASCU-SCREEN (carotid Dopplers, ABI and AAA Doppler) before November 2024      Relevant Orders   VAS US VASCUSCREEN   Coronary artery calcification - Primary (Chronic)    Prolonged conversation with Physiology of atherosclerosis plaque and calcification as a marker of plaque.  We discussed that age 70, the standard screening test with a coronary calcium score would probably be if anything a distraction as a would likely be elevated.  The absence of symptoms I do not think would be necessary to do any type  of ischemic evaluation and an abnormal coronary calcium score may lead to the consideration of doing that.  I would prefer to avoid ischemic CAD evaluation unless his vascular screen is grossly abnormal.  Main focus of treatment going forward to be to continue to stay active and healthy, maintaining adequate blood pressure control but also changing the trajectory of his lipid panel.  Discussed the importance of regular exercise and recommended that you start or continue a regular exercise program for good health.  Increase exercise up to 30-40 minutes daily        Relevant Orders   VAS US VASCUSCREEN   Hyperlipidemia with target low density lipoprotein (LDL) cholesterol less than 70 mg/dL (Chronic)    With evidence of aortic and coronary calcification, would like to see LDL less than 70.  Has previously been that way for the last 2 years but this year was high which indicates may have been related to his recent trip to [place] with dietary indulgence.  He is on Zetia and statin.   Would simply reassess his lipid panel and sure that his LDL is not on the trend up.  Was 89 on recent study which is still acceptable, but not nearly as acceptable as the previous.  2 years.  Low threshold to consider adding Nexletol to the Zetia.      Relevant Orders   Lipid panel   VAS US VASCUSCREEN     Other   Screening for cardiovascular condition    Both coronary calcification noted on CT scans. Discussed performing a vascular screen to assess for carotid artery disease and peripheral artery disease, given the known atherosclerosis. -Schedule VASCU-SCREEN (carotid Dopplers, ABI and AAA Doppler) before November 2024. -Continue aspirin, statin and Zetia for prevention of cardiovascular events.      Relevant Orders   EKG 12-Lead (Completed)   VAS US VASCUSCREEN    Physical Inactivity Limited daily physical activity reported. No symptoms of chest pain or shortness of breath with exertion. -Encouraged to increase daily physical activity to 30-45 minutes per day, which can be split into multiple sessions. -Use upcoming vacation as motivation to increase activity level.  Blood Pressure Home readings in the 130s/80s. -Monitor home blood pressure readings and consider medication adjustment if readings consistently elevated.           Dispo: Return in about 10 weeks (around 01/08/2023) for Routine Follow-up after testing ~ 1-2 months.  Total time spent: 36 min spent with patient + 25 min spent charting = 61 min     Signed, Marykay Lex, MD, MS Bryan Lemma, M.D., M.S. Interventional Cardiologist  [facility]  Pager # [phone] Phone # [phone] [address] [place], [place] [address]

## 2022-10-30 NOTE — Patient Instructions (Signed)
Medication Instructions:  none *If you need a refill on your cardiac medications before your next appointment, please call your pharmacy*   Lab Work: Fasting Lipid  If you have labs (blood work) drawn today and your tests are completely normal, you will receive your results only by: MyChart Message (if you have MyChart) OR A paper copy in the mail If you have any lab test that is abnormal or we need to change your treatment, we will call you to review the results.   Testing/Procedures: We will schedule you for vascular screening which includes carotid artery abdominal aortic peripheral arteries [address]    Follow-Up: At [facility], you and your health needs are our priority.  As part of our continuing mission to provide you with exceptional heart care, we have created designated Provider Care Teams.  These Care Teams include your primary Cardiologist (physician) and Advanced Practice Providers (APPs -  Physician Assistants and Nurse Practitioners) who all work together to provide you with the care you need, when you need it.   Your next appointment:   2-3 month(s)  Provider:   Herbie Baltimore     Other Instructions Your physician discussed the importance of regular exercise and recommended that you start or continue a regular exercise program for good health.  Increase exercise up to 30-40 minutes daily

## 2022-11-01 ENCOUNTER — Encounter: Payer: Self-pay | Admitting: Cardiology

## 2022-11-01 DIAGNOSIS — Z136 Encounter for screening for cardiovascular disorders: Secondary | ICD-10-CM | POA: Insufficient documentation

## 2022-11-01 NOTE — Assessment & Plan Note (Signed)
With evidence of aortic and coronary calcification, would like to see LDL less than 70.  Has previously been that way for the last 2 years but this year was high which indicates may have been related to his recent trip to [place] with dietary indulgence.  He is on Zetia and statin.   Would simply reassess his lipid panel and sure that his LDL is not on the trend up.  Was 89 on recent study which is still acceptable, but not nearly as acceptable as the previous.  2 years.  Low threshold to consider adding Nexletol to the Zetia.

## 2022-11-01 NOTE — Assessment & Plan Note (Addendum)
Chronic aortic and coronary atherosclerosis noted on CT scan from 2017. Discussed the pathophysiology of atherosclerosis and the importance of managing risk factors. LDL has increased from 63 to 89 over the past year. -Continue rosuvastatin and Zetia for cholesterol management. -Check lipid panel in November 2024. -Consider increasing frequency of cholesterol checks to every 6 months due to recent increase in LDL. -Schedule VASCU-SCREEN (carotid Dopplers, ABI and AAA Doppler) before November 2024

## 2022-11-01 NOTE — Assessment & Plan Note (Addendum)
Prolonged conversation with Physiology of atherosclerosis plaque and calcification as a marker of plaque.  We discussed that age 77, the standard screening test with a coronary calcium score would probably be if anything a distraction as a would likely be elevated.  The absence of symptoms I do not think would be necessary to do any type of ischemic evaluation and an abnormal coronary calcium score may lead to the consideration of doing that.  I would prefer to avoid ischemic CAD evaluation unless his vascular screen is grossly abnormal.  Main focus of treatment going forward to be to continue to stay active and healthy, maintaining adequate blood pressure control but also changing the trajectory of his lipid panel.  Discussed the importance of regular exercise and recommended that you start or continue a regular exercise program for good health.  Increase exercise up to 30-40 minutes daily

## 2022-11-01 NOTE — Assessment & Plan Note (Signed)
Both coronary calcification noted on CT scans. Discussed performing a vascular screen to assess for carotid artery disease and peripheral artery disease, given the known atherosclerosis. -Schedule VASCU-SCREEN (carotid Dopplers, ABI and AAA Doppler) before November 2024. -Continue aspirin, statin and Zetia for prevention of cardiovascular events.

## 2022-11-08 ENCOUNTER — Ambulatory Visit (HOSPITAL_COMMUNITY)
Admission: RE | Admit: 2022-11-08 | Discharge: 2022-11-08 | Disposition: A | Discharge status: Home / Self Care | Payer: Medicare HMO | Source: Ambulatory Visit | Attending: Cardiology | Admitting: Cardiology

## 2022-11-08 DIAGNOSIS — E785 Hyperlipidemia, unspecified: Secondary | ICD-10-CM | POA: Diagnosis not present

## 2022-11-08 DIAGNOSIS — Z136 Encounter for screening for cardiovascular disorders: Secondary | ICD-10-CM

## 2022-11-08 DIAGNOSIS — I2510 Atherosclerotic heart disease of native coronary artery without angina pectoris: Secondary | ICD-10-CM

## 2022-11-08 DIAGNOSIS — I700 Atherosclerosis of aorta: Secondary | ICD-10-CM

## 2022-11-08 LAB — LIPID PANEL
Chol/HDL Ratio: 2.5 {ratio} (ref 0.0–5.0)
Cholesterol, Total: 162 mg/dL (ref 100–199)
HDL: 64 mg/dL (ref >39)
LDL Chol Calc (NIH): 81 mg/dL (ref 0–99)
Triglycerides: 92 mg/dL (ref 0–149)
VLDL Cholesterol Cal: 17 mg/dL (ref 5–40)

## 2022-11-13 ENCOUNTER — Encounter (HOSPITAL_COMMUNITY): Payer: Self-pay | Admitting: Cardiology

## 2022-12-26 DIAGNOSIS — C673 Malignant neoplasm of anterior wall of bladder: Secondary | ICD-10-CM | POA: Diagnosis not present

## 2023-04-19 DIAGNOSIS — M9903 Segmental and somatic dysfunction of lumbar region: Secondary | ICD-10-CM | POA: Diagnosis not present

## 2023-04-19 DIAGNOSIS — M9905 Segmental and somatic dysfunction of pelvic region: Secondary | ICD-10-CM | POA: Diagnosis not present

## 2023-04-19 DIAGNOSIS — M5451 Vertebrogenic low back pain: Secondary | ICD-10-CM | POA: Diagnosis not present

## 2023-04-19 DIAGNOSIS — M9902 Segmental and somatic dysfunction of thoracic region: Secondary | ICD-10-CM | POA: Diagnosis not present

## 2023-04-22 DIAGNOSIS — M5451 Vertebrogenic low back pain: Secondary | ICD-10-CM | POA: Diagnosis not present

## 2023-04-22 DIAGNOSIS — M9903 Segmental and somatic dysfunction of lumbar region: Secondary | ICD-10-CM | POA: Diagnosis not present

## 2023-04-22 DIAGNOSIS — M9905 Segmental and somatic dysfunction of pelvic region: Secondary | ICD-10-CM | POA: Diagnosis not present

## 2023-04-22 DIAGNOSIS — M9902 Segmental and somatic dysfunction of thoracic region: Secondary | ICD-10-CM | POA: Diagnosis not present

## 2023-04-23 DIAGNOSIS — M545 Low back pain, unspecified: Secondary | ICD-10-CM | POA: Diagnosis not present

## 2023-04-24 DIAGNOSIS — M9902 Segmental and somatic dysfunction of thoracic region: Secondary | ICD-10-CM | POA: Diagnosis not present

## 2023-04-24 DIAGNOSIS — M9905 Segmental and somatic dysfunction of pelvic region: Secondary | ICD-10-CM | POA: Diagnosis not present

## 2023-04-24 DIAGNOSIS — M9903 Segmental and somatic dysfunction of lumbar region: Secondary | ICD-10-CM | POA: Diagnosis not present

## 2023-04-24 DIAGNOSIS — M5451 Vertebrogenic low back pain: Secondary | ICD-10-CM | POA: Diagnosis not present

## 2023-04-26 DIAGNOSIS — M9905 Segmental and somatic dysfunction of pelvic region: Secondary | ICD-10-CM | POA: Diagnosis not present

## 2023-04-26 DIAGNOSIS — M9903 Segmental and somatic dysfunction of lumbar region: Secondary | ICD-10-CM | POA: Diagnosis not present

## 2023-04-26 DIAGNOSIS — M9902 Segmental and somatic dysfunction of thoracic region: Secondary | ICD-10-CM | POA: Diagnosis not present

## 2023-04-26 DIAGNOSIS — M5451 Vertebrogenic low back pain: Secondary | ICD-10-CM | POA: Diagnosis not present

## 2023-05-30 DIAGNOSIS — Z125 Encounter for screening for malignant neoplasm of prostate: Secondary | ICD-10-CM | POA: Diagnosis not present

## 2023-05-30 DIAGNOSIS — E7800 Pure hypercholesterolemia, unspecified: Secondary | ICD-10-CM | POA: Diagnosis not present

## 2023-06-04 ENCOUNTER — Other Ambulatory Visit: Payer: Self-pay | Admitting: Internal Medicine

## 2023-06-04 DIAGNOSIS — N401 Enlarged prostate with lower urinary tract symptoms: Secondary | ICD-10-CM | POA: Diagnosis not present

## 2023-06-04 DIAGNOSIS — E7800 Pure hypercholesterolemia, unspecified: Secondary | ICD-10-CM | POA: Diagnosis not present

## 2023-06-04 DIAGNOSIS — E041 Nontoxic single thyroid nodule: Secondary | ICD-10-CM | POA: Diagnosis not present

## 2023-06-04 DIAGNOSIS — I2510 Atherosclerotic heart disease of native coronary artery without angina pectoris: Secondary | ICD-10-CM | POA: Diagnosis not present

## 2023-06-04 DIAGNOSIS — Z8551 Personal history of malignant neoplasm of bladder: Secondary | ICD-10-CM | POA: Diagnosis not present

## 2023-06-04 DIAGNOSIS — I700 Atherosclerosis of aorta: Secondary | ICD-10-CM | POA: Diagnosis not present

## 2023-06-04 DIAGNOSIS — N200 Calculus of kidney: Secondary | ICD-10-CM | POA: Diagnosis not present

## 2023-06-04 DIAGNOSIS — Z0000 Encounter for general adult medical examination without abnormal findings: Secondary | ICD-10-CM | POA: Diagnosis not present

## 2023-06-06 ENCOUNTER — Ambulatory Visit
Admission: RE | Admit: 2023-06-06 | Discharge: 2023-06-06 | Disposition: A | Discharge status: Home / Self Care | Source: Ambulatory Visit | Attending: Internal Medicine | Admitting: Internal Medicine

## 2023-06-06 DIAGNOSIS — E041 Nontoxic single thyroid nodule: Secondary | ICD-10-CM

## 2023-06-06 DIAGNOSIS — E042 Nontoxic multinodular goiter: Secondary | ICD-10-CM | POA: Diagnosis not present

## 2023-06-12 DIAGNOSIS — M9902 Segmental and somatic dysfunction of thoracic region: Secondary | ICD-10-CM | POA: Diagnosis not present

## 2023-06-12 DIAGNOSIS — M9905 Segmental and somatic dysfunction of pelvic region: Secondary | ICD-10-CM | POA: Diagnosis not present

## 2023-06-12 DIAGNOSIS — M9903 Segmental and somatic dysfunction of lumbar region: Secondary | ICD-10-CM | POA: Diagnosis not present

## 2023-06-12 DIAGNOSIS — M5451 Vertebrogenic low back pain: Secondary | ICD-10-CM | POA: Diagnosis not present

## 2023-06-14 ENCOUNTER — Other Ambulatory Visit: Payer: Self-pay | Admitting: Endocrinology

## 2023-06-14 DIAGNOSIS — E041 Nontoxic single thyroid nodule: Secondary | ICD-10-CM

## 2023-06-19 DIAGNOSIS — M9905 Segmental and somatic dysfunction of pelvic region: Secondary | ICD-10-CM | POA: Diagnosis not present

## 2023-06-19 DIAGNOSIS — M9903 Segmental and somatic dysfunction of lumbar region: Secondary | ICD-10-CM | POA: Diagnosis not present

## 2023-06-19 DIAGNOSIS — M5451 Vertebrogenic low back pain: Secondary | ICD-10-CM | POA: Diagnosis not present

## 2023-06-19 DIAGNOSIS — M9902 Segmental and somatic dysfunction of thoracic region: Secondary | ICD-10-CM | POA: Diagnosis not present

## 2023-07-01 DIAGNOSIS — H2513 Age-related nuclear cataract, bilateral: Secondary | ICD-10-CM | POA: Diagnosis not present

## 2023-07-01 DIAGNOSIS — D3131 Benign neoplasm of right choroid: Secondary | ICD-10-CM | POA: Diagnosis not present

## 2023-07-01 DIAGNOSIS — H5203 Hypermetropia, bilateral: Secondary | ICD-10-CM | POA: Diagnosis not present

## 2023-07-03 DIAGNOSIS — M5451 Vertebrogenic low back pain: Secondary | ICD-10-CM | POA: Diagnosis not present

## 2023-07-03 DIAGNOSIS — M9905 Segmental and somatic dysfunction of pelvic region: Secondary | ICD-10-CM | POA: Diagnosis not present

## 2023-07-03 DIAGNOSIS — L814 Other melanin hyperpigmentation: Secondary | ICD-10-CM | POA: Diagnosis not present

## 2023-07-03 DIAGNOSIS — L218 Other seborrheic dermatitis: Secondary | ICD-10-CM | POA: Diagnosis not present

## 2023-07-03 DIAGNOSIS — M9902 Segmental and somatic dysfunction of thoracic region: Secondary | ICD-10-CM | POA: Diagnosis not present

## 2023-07-03 DIAGNOSIS — L821 Other seborrheic keratosis: Secondary | ICD-10-CM | POA: Diagnosis not present

## 2023-07-03 DIAGNOSIS — M9903 Segmental and somatic dysfunction of lumbar region: Secondary | ICD-10-CM | POA: Diagnosis not present

## 2023-07-05 ENCOUNTER — Other Ambulatory Visit (HOSPITAL_COMMUNITY)
Admission: RE | Admit: 2023-07-05 | Discharge: 2023-07-05 | Disposition: A | Discharge status: Home / Self Care | Source: Ambulatory Visit | Attending: Radiology | Admitting: Radiology

## 2023-07-05 ENCOUNTER — Ambulatory Visit
Admission: RE | Admit: 2023-07-05 | Discharge: 2023-07-05 | Disposition: A | Discharge status: Home / Self Care | Source: Ambulatory Visit | Attending: Endocrinology | Admitting: Endocrinology

## 2023-07-05 DIAGNOSIS — E041 Nontoxic single thyroid nodule: Secondary | ICD-10-CM | POA: Diagnosis not present

## 2023-07-08 LAB — CYTOLOGY - NON PAP

## 2023-07-09 DIAGNOSIS — H2513 Age-related nuclear cataract, bilateral: Secondary | ICD-10-CM | POA: Diagnosis not present

## 2023-07-09 DIAGNOSIS — H43823 Vitreomacular adhesion, bilateral: Secondary | ICD-10-CM | POA: Diagnosis not present

## 2023-07-09 DIAGNOSIS — D3131 Benign neoplasm of right choroid: Secondary | ICD-10-CM | POA: Diagnosis not present

## 2023-07-10 DIAGNOSIS — M9903 Segmental and somatic dysfunction of lumbar region: Secondary | ICD-10-CM | POA: Diagnosis not present

## 2023-07-10 DIAGNOSIS — M5451 Vertebrogenic low back pain: Secondary | ICD-10-CM | POA: Diagnosis not present

## 2023-07-10 DIAGNOSIS — M9905 Segmental and somatic dysfunction of pelvic region: Secondary | ICD-10-CM | POA: Diagnosis not present

## 2023-07-10 DIAGNOSIS — M9902 Segmental and somatic dysfunction of thoracic region: Secondary | ICD-10-CM | POA: Diagnosis not present

## 2023-07-15 DIAGNOSIS — M9903 Segmental and somatic dysfunction of lumbar region: Secondary | ICD-10-CM | POA: Diagnosis not present

## 2023-07-15 DIAGNOSIS — M5451 Vertebrogenic low back pain: Secondary | ICD-10-CM | POA: Diagnosis not present

## 2023-07-15 DIAGNOSIS — M9902 Segmental and somatic dysfunction of thoracic region: Secondary | ICD-10-CM | POA: Diagnosis not present

## 2023-07-15 DIAGNOSIS — M9905 Segmental and somatic dysfunction of pelvic region: Secondary | ICD-10-CM | POA: Diagnosis not present

## 2023-07-19 ENCOUNTER — Emergency Department (HOSPITAL_COMMUNITY)

## 2023-07-19 ENCOUNTER — Other Ambulatory Visit: Payer: Self-pay

## 2023-07-19 ENCOUNTER — Encounter (HOSPITAL_COMMUNITY): Payer: Self-pay

## 2023-07-19 ENCOUNTER — Inpatient Hospital Stay (HOSPITAL_COMMUNITY)

## 2023-07-19 ENCOUNTER — Inpatient Hospital Stay (HOSPITAL_COMMUNITY)
Admission: EM | Admit: 2023-07-19 | Discharge: 2023-07-22 | DRG: 390 | Disposition: A | Discharge status: Home / Self Care | Attending: Family Medicine | Admitting: Family Medicine

## 2023-07-19 DIAGNOSIS — N529 Male erectile dysfunction, unspecified: Secondary | ICD-10-CM | POA: Diagnosis present

## 2023-07-19 DIAGNOSIS — Z8249 Family history of ischemic heart disease and other diseases of the circulatory system: Secondary | ICD-10-CM

## 2023-07-19 DIAGNOSIS — R9431 Abnormal electrocardiogram [ECG] [EKG]: Secondary | ICD-10-CM | POA: Insufficient documentation

## 2023-07-19 DIAGNOSIS — D72829 Elevated white blood cell count, unspecified: Secondary | ICD-10-CM | POA: Diagnosis not present

## 2023-07-19 DIAGNOSIS — Z79899 Other long term (current) drug therapy: Secondary | ICD-10-CM | POA: Diagnosis not present

## 2023-07-19 DIAGNOSIS — Z8551 Personal history of malignant neoplasm of bladder: Secondary | ICD-10-CM | POA: Diagnosis not present

## 2023-07-19 DIAGNOSIS — Z974 Presence of external hearing-aid: Secondary | ICD-10-CM | POA: Diagnosis not present

## 2023-07-19 DIAGNOSIS — N400 Enlarged prostate without lower urinary tract symptoms: Secondary | ICD-10-CM | POA: Diagnosis present

## 2023-07-19 DIAGNOSIS — K5730 Diverticulosis of large intestine without perforation or abscess without bleeding: Secondary | ICD-10-CM | POA: Diagnosis not present

## 2023-07-19 DIAGNOSIS — Z87891 Personal history of nicotine dependence: Secondary | ICD-10-CM | POA: Diagnosis not present

## 2023-07-19 DIAGNOSIS — N281 Cyst of kidney, acquired: Secondary | ICD-10-CM | POA: Diagnosis not present

## 2023-07-19 DIAGNOSIS — E876 Hypokalemia: Secondary | ICD-10-CM | POA: Diagnosis not present

## 2023-07-19 DIAGNOSIS — Z4682 Encounter for fitting and adjustment of non-vascular catheter: Secondary | ICD-10-CM | POA: Diagnosis not present

## 2023-07-19 DIAGNOSIS — R000 Tachycardia, unspecified: Secondary | ICD-10-CM | POA: Diagnosis not present

## 2023-07-19 DIAGNOSIS — Z7982 Long term (current) use of aspirin: Secondary | ICD-10-CM | POA: Diagnosis not present

## 2023-07-19 DIAGNOSIS — R739 Hyperglycemia, unspecified: Secondary | ICD-10-CM

## 2023-07-19 DIAGNOSIS — E785 Hyperlipidemia, unspecified: Secondary | ICD-10-CM | POA: Diagnosis not present

## 2023-07-19 DIAGNOSIS — N200 Calculus of kidney: Secondary | ICD-10-CM | POA: Diagnosis present

## 2023-07-19 DIAGNOSIS — K56600 Partial intestinal obstruction, unspecified as to cause: Principal | ICD-10-CM | POA: Diagnosis present

## 2023-07-19 DIAGNOSIS — K56609 Unspecified intestinal obstruction, unspecified as to partial versus complete obstruction: Principal | ICD-10-CM | POA: Diagnosis present

## 2023-07-19 LAB — CBC
HCT: 48.8 % (ref 39.0–52.0)
Hemoglobin: 16.8 g/dL (ref 13.0–17.0)
MCH: 30.2 pg (ref 26.0–34.0)
MCHC: 34.4 g/dL (ref 30.0–36.0)
MCV: 87.6 fL (ref 80.0–100.0)
Platelets: 341 K/uL (ref 150–400)
RBC: 5.57 MIL/uL (ref 4.22–5.81)
RDW: 13.9 % (ref 11.5–15.5)
WBC: 15 K/uL — ABNORMAL HIGH (ref 4.0–10.5)
nRBC: 0 % (ref 0.0–0.2)

## 2023-07-19 LAB — COMPREHENSIVE METABOLIC PANEL WITH GFR
ALT: 17 U/L (ref 0–44)
AST: 20 U/L (ref 15–41)
Albumin: 4.2 g/dL (ref 3.5–5.0)
Alkaline Phosphatase: 71 U/L (ref 38–126)
Anion gap: 17 — ABNORMAL HIGH (ref 5–15)
BUN: 19 mg/dL (ref 8–23)
CO2: 19 mmol/L — ABNORMAL LOW (ref 22–32)
Calcium: 9.6 mg/dL (ref 8.9–10.3)
Chloride: 100 mmol/L (ref 98–111)
Creatinine, Ser: 0.9 mg/dL (ref 0.61–1.24)
GFR, Estimated: >60 mL/min (ref >60)
Glucose, Bld: 141 mg/dL — ABNORMAL HIGH (ref 70–99)
Potassium: 3.7 mmol/L (ref 3.5–5.1)
Sodium: 136 mmol/L (ref 135–145)
Total Bilirubin: 1.6 mg/dL — ABNORMAL HIGH (ref 0.0–1.2)
Total Protein: 7.7 g/dL (ref 6.5–8.1)

## 2023-07-19 LAB — LIPASE, BLOOD: Lipase: 33 U/L (ref 11–51)

## 2023-07-19 MED ORDER — DIATRIZOATE MEGLUMINE & SODIUM 66-10 % PO SOLN: 90.0000 mL | Freq: Once | ORAL | Status: DC

## 2023-07-19 MED ORDER — IOHEXOL 300 MG/ML  SOLN
100.0000 mL | Freq: Once | INTRAMUSCULAR | Status: AC | PRN
  Administered 2023-07-19: 100 mL via INTRAVENOUS

## 2023-07-19 MED ORDER — ACETAMINOPHEN 650 MG RE SUPP: 650.0000 mg | Freq: Four times a day (QID) | RECTAL | Status: DC | PRN

## 2023-07-19 MED ORDER — SODIUM CHLORIDE 0.9% FLUSH
3.0000 mL | Freq: Two times a day (BID) | INTRAVENOUS | Status: DC
  Administered 2023-07-19 – 2023-07-21 (×6): 3 mL via INTRAVENOUS

## 2023-07-19 MED ORDER — SODIUM CHLORIDE 0.9% FLUSH
3.0000 mL | Freq: Two times a day (BID) | INTRAVENOUS | Status: DC
  Administered 2023-07-20 (×2): 3 mL via INTRAVENOUS

## 2023-07-19 MED ORDER — SIMETHICONE 40 MG/0.6ML PO SUSP: 80.0000 mg | Freq: Four times a day (QID) | ORAL | Status: DC | PRN

## 2023-07-19 MED ORDER — LACTATED RINGERS IV BOLUS: 1000.0000 mL | Freq: Three times a day (TID) | INTRAVENOUS | Status: AC | PRN

## 2023-07-19 MED ORDER — PHENOL 1.4 % MT LIQD: 2.0000 | OROMUCOSAL | Status: DC | PRN

## 2023-07-19 MED ORDER — SODIUM CHLORIDE 0.9% FLUSH: 3.0000 mL | INTRAVENOUS | Status: DC | PRN

## 2023-07-19 MED ORDER — SODIUM CHLORIDE 0.9 % IV SOLN: 250.0000 mL | INTRAVENOUS | Status: AC | PRN

## 2023-07-19 MED ORDER — DIPHENHYDRAMINE HCL 50 MG/ML IJ SOLN: 12.5000 mg | Freq: Four times a day (QID) | INTRAMUSCULAR | Status: DC | PRN

## 2023-07-19 MED ORDER — BISACODYL 10 MG RE SUPP
10.0000 mg | Freq: Every day | RECTAL | Status: DC
  Filled 2023-07-19 (×2): qty 1

## 2023-07-19 MED ORDER — MORPHINE SULFATE (PF) 4 MG/ML IV SOLN
4.0000 mg | Freq: Once | INTRAVENOUS | Status: AC
  Administered 2023-07-19: 4 mg via INTRAVENOUS
  Filled 2023-07-19: qty 1

## 2023-07-19 MED ORDER — SALINE SPRAY 0.65 % NA SOLN: 1.0000 | Freq: Four times a day (QID) | NASAL | Status: DC | PRN

## 2023-07-19 MED ORDER — ACETAMINOPHEN 325 MG PO TABS: 650.0000 mg | ORAL_TABLET | Freq: Four times a day (QID) | ORAL | Status: DC | PRN

## 2023-07-19 MED ORDER — NAPHAZOLINE-GLYCERIN 0.012-0.25 % OP SOLN: 1.0000 [drp] | Freq: Four times a day (QID) | OPHTHALMIC | Status: DC | PRN

## 2023-07-19 MED ORDER — TRIMETHOBENZAMIDE HCL 100 MG/ML IM SOLN: 200.0000 mg | Freq: Four times a day (QID) | INTRAMUSCULAR | Status: DC | PRN

## 2023-07-19 MED ORDER — MAGIC MOUTHWASH: 15.0000 mL | Freq: Four times a day (QID) | ORAL | Status: DC | PRN

## 2023-07-19 MED ORDER — LACTATED RINGERS IV BOLUS
1000.0000 mL | Freq: Once | INTRAVENOUS | Status: AC
  Administered 2023-07-19: 1000 mL via INTRAVENOUS

## 2023-07-19 MED ORDER — ONDANSETRON HCL 4 MG/2ML IJ SOLN: 4.0000 mg | Freq: Four times a day (QID) | INTRAMUSCULAR | Status: DC | PRN

## 2023-07-19 MED ORDER — SODIUM CHLORIDE 0.9 % IV BOLUS
1000.0000 mL | Freq: Once | INTRAVENOUS | Status: AC
  Administered 2023-07-19: 1000 mL via INTRAVENOUS

## 2023-07-19 MED ORDER — ONDANSETRON HCL 4 MG/2ML IJ SOLN
4.0000 mg | Freq: Once | INTRAMUSCULAR | Status: AC
  Administered 2023-07-19: 4 mg via INTRAVENOUS
  Filled 2023-07-19: qty 2

## 2023-07-19 MED ORDER — FENTANYL CITRATE PF 50 MCG/ML IJ SOSY: 25.0000 ug | PREFILLED_SYRINGE | INTRAMUSCULAR | Status: DC | PRN

## 2023-07-19 MED ORDER — ALUM & MAG HYDROXIDE-SIMETH 200-200-20 MG/5ML PO SUSP: 30.0000 mL | Freq: Four times a day (QID) | ORAL | Status: DC | PRN

## 2023-07-19 MED ORDER — HEPARIN SODIUM (PORCINE) 5000 UNIT/ML IJ SOLN
5000.0000 [IU] | Freq: Three times a day (TID) | INTRAMUSCULAR | Status: DC
  Administered 2023-07-19 – 2023-07-22 (×9): 5000 [IU] via SUBCUTANEOUS
  Filled 2023-07-19 (×9): qty 1

## 2023-07-19 MED ORDER — LACTATED RINGERS IV SOLN: INTRAVENOUS | Status: AC

## 2023-07-19 MED ORDER — PROCHLORPERAZINE EDISYLATE 10 MG/2ML IJ SOLN: 5.0000 mg | INTRAMUSCULAR | Status: DC | PRN

## 2023-07-19 MED ORDER — MENTHOL 3 MG MT LOZG: 1.0000 | LOZENGE | OROMUCOSAL | Status: DC | PRN

## 2023-07-19 MED ORDER — METHOCARBAMOL 1000 MG/10ML IJ SOLN: 1000.0000 mg | Freq: Four times a day (QID) | INTRAMUSCULAR | Status: DC | PRN

## 2023-07-19 NOTE — TOC Initial Note (Signed)
 Transition of Care [facility]) - Initial/Assessment Note    Patient Details  Name: Douglas Holmes MRN: [id number] Date of Birth: 10-May-1945  Transition of Care [facility]) CM/SW Contact:    Sonda Manuella Quill, RN Phone Number: 07/19/2023, 3:37 PM  Clinical Narrative:                 [place] w/ pt in room; he says he lives at home, and he plans to return at d/c; pt identified POC wife Douglas Holmes [phone]); she will provide transportation; pt verified insurance; he denied SDOH risks; pt says he does not have DME, HH services, or home oxygen; TOC is following.  Expected Discharge Plan: Home/Self Care Barriers to Discharge: Continued Medical Work up   Patient Goals and CMS Choice Patient states their goals for this hospitalization and ongoing recovery are:: home CMS Medicare.gov Compare Post Acute Care list provided to:: Patient    ownership interest in [facility].provided to:: Patient    Expected Discharge Plan and Services       Living arrangements for the past 2 months: Single Family Home                                      Prior Living Arrangements/Services Living arrangements for the past 2 months: Single Family Home Lives with:: Spouse Patient language and need for interpreter reviewed:: Yes Do you feel safe going back to the place where you live?: Yes      Need for Family Participation in Patient Care: Yes (Comment) Care giver support system in place?: Yes (comment) Current home services:  (n/a) Criminal Activity/Legal Involvement Pertinent to Current Situation/Hospitalization: No - Comment as needed  Activities of Daily Living   ADL Screening (condition at time of admission) Independently performs ADLs?: Yes (appropriate for developmental age) Is the patient deaf or have difficulty hearing?: No Does the patient have difficulty seeing, even when wearing glasses/contacts?: No Does the patient have difficulty concentrating, remembering, or  making decisions?: No  Permission Sought/Granted Permission sought to share information with : Case Manager Permission granted to share information with : Yes, Verbal Permission Granted  Share Information with NAME: Case Manager     Permission granted to share info w Relationship: Douglas Holmes (wife) [phone]     Emotional Assessment Appearance:: Appears stated age Attitude/Demeanor/Rapport: Gracious Affect (typically observed): Accepting Orientation: : Oriented to Self, Oriented to Place, Oriented to  Time, Oriented to Situation Alcohol / Substance Use: Not Applicable Psych Involvement: No (comment)  Admission diagnosis:  Small bowel obstruction (HCC) [K56.609] Elevated random blood glucose level [R73.9] Patient Active Problem List   Diagnosis Date Noted   QT prolongation 07/19/2023   Screening for cardiovascular condition 11/01/2022   Aortic atherosclerosis (HCC) 10/30/2022   Coronary artery calcification 10/30/2022   Hyperlipidemia 10/30/2022   Right inguinal hernia 07/02/2018   SBO (small bowel obstruction) (HCC) 07/05/2015   History of bladder cancer 07/05/2015   BPH (benign prostatic hyperplasia) 07/05/2015   History of diverticulosis 07/05/2015   Small bowel obstruction (HCC) 07/05/2015   Focal dystonia 02/10/2013   PCP:  Clarice Nottingham, MD Pharmacy:   [facility] [phone] [address], [place] - [address] AT [facility] [address] [place] [address] Phone: [phone] Fax: [phone]  [facility] [address] [place] - [address] [place] [address] Phone: [phone] Fax:  [phone]  [id number] [address] [facility] - [address] [place] [address] Phone: [phone] Fax: [phone]     Social Drivers of Health (SDOH) Social History: SDOH Screenings   Food Insecurity: No Food Insecurity (07/19/2023)  Housing: Low Risk  (07/19/2023)   Transportation Needs: No Transportation Needs (07/19/2023)  Utilities: Not At Risk (07/19/2023)  Social Connections: Moderately Integrated (07/19/2023)  Tobacco Use: Medium Risk (07/19/2023)   SDOH Interventions: Food Insecurity Interventions: Intervention Not Indicated, Inpatient TOC Housing Interventions: Intervention Not Indicated, Inpatient TOC Transportation Interventions: Intervention Not Indicated, Inpatient TOC Utilities Interventions: Intervention Not Indicated, Inpatient TOC   Readmission Risk Interventions     No data to display

## 2023-07-19 NOTE — ED Notes (Signed)
 Admitting doctor in room now

## 2023-07-19 NOTE — ED Notes (Signed)
 Xray called for xray confirmation placement.

## 2023-07-19 NOTE — ED Provider Notes (Signed)
 [place] EMERGENCY DEPARTMENT AT [facility] Provider Note   CSN: [id number] Arrival date & time: 07/19/23  9757     Patient presents with: Abdominal Pain   Douglas Holmes is a 78 y.o. male.   The history is provided by the patient.  Abdominal Pain He has history of hyperlipidemia, diverticulitis, bowel obstruction and comes in with onset yesterday of pain across his upper abdomen with associated nausea and vomiting.  Pain does not radiate.  Pain does subside briefly following emesis.  He has not had any bowel movements or flatus.  He feels his abdomen is slightly distended compared with baseline.  Symptoms are similar to prior episodes of bowel obstruction.   Prior to Admission medications   Medication Sig Start Date End Date Taking? Authorizing Provider  aspirin EC 81 MG tablet Take 81 mg by mouth daily.    [provider]  Boswellia-Glucosamine-Vit D (OSTEO BI-FLEX ONE PER DAY) TABS Take 1 tablet by mouth daily.    [provider]  Dentifrices (FLUORIDE TOOTHPASTE DT) Place onto teeth daily.    [provider]  ezetimibe (ZETIA) 10 MG tablet Take 10 mg by mouth daily. 04/19/20   [provider]  Misc Natural Products (NEURIVA PO) Take by mouth daily.    [provider]  Multiple Vitamin (MULTIVITAMIN WITH MINERALS) TABS tablet Take 1 tablet by mouth daily.    [provider]  rosuvastatin (CRESTOR) 20 MG tablet Take 20 mg by mouth daily.    [provider]  tamsulosin (FLOMAX) 0.4 MG CAPS capsule Take 0.4 mg by mouth daily. 07/17/22   [provider]    Allergies: Patient has no known allergies.    Review of Systems  Gastrointestinal:  Positive for abdominal pain.  All other systems reviewed and are negative.   Updated Vital Signs BP 103/71   Pulse (!) 130   Temp 97.9 F (36.6 C) (Oral)   Resp 19   Ht 6' 1 (1.854 m)   Wt 105.8 kg   SpO2 95%   BMI 30.77 kg/m   Physical Exam Vitals and  nursing note reviewed.   78 year old male, resting comfortably and in no acute distress. Vital signs are significant for rapid heart rate. Oxygen saturation is 95%, which is normal. Head is normocephalic and atraumatic. PERRLA, EOMI.  Neck is nontender and supple without adenopathy. Back is nontender and there is no CVA tenderness. Lungs are clear without rales, wheezes, or rhonchi. Chest is nontender. Heart has regular rate and rhythm without murmur. Abdomen is soft, flat, with mild tenderness across the upper abdomen.  There is no rebound or guarding. Extremities have no cyanosis or edema, full range of motion is present. Skin is warm and dry without rash. Neurologic: Awake and alert, moves all extremities equally.  (all labs ordered are listed, but only abnormal results are displayed) Labs Reviewed  CBC - Abnormal; Notable for the following components:      Result Value   WBC 15.0 (*)    All other components within normal limits  LIPASE, BLOOD  COMPREHENSIVE METABOLIC PANEL WITH GFR  URINALYSIS, ROUTINE W REFLEX MICROSCOPIC    EKG: EKG Interpretation Date/Time:  Friday July 19 2023 02:57:28 EDT Ventricular Rate:  117 PR Interval:  149 QRS Duration:  106 QT Interval:  373 QTC Calculation: 521 R Axis:   27  Text Interpretation: Sinus tachycardia Multiple ventricular premature complexes Inferior infarct, old Prolonged QT interval When compared with ECG of 10/30/2022,  HEART RATE has increased Confirmed by Raford Lenis ([id number]) on 07/19/2023 3:09:29 AM  Radiology: No results found.   Procedures   Medications Ordered in the ED  sodium chloride  0.9 % bolus 1,000 mL (has no administration in time range)  ondansetron  (ZOFRAN ) injection 4 mg (has no administration in time range)  morphine  (PF) 4 MG/ML injection 4 mg (has no administration in time range)                                    Medical Decision Making Amount and/or Complexity of Data Reviewed Labs:  ordered. Radiology: ordered.  Risk Prescription drug management. Decision regarding hospitalization.   Abdominal pain with vomiting.  This is a presentation with a wide range of treatment options and carries with it a high risk of morbidity and complications.  Differential diagnosis includes, but is not limited to, bowel obstruction, diverticulitis, cholecystitis, pancreatitis.  I have reviewed his electrocardiogram, my interpretation is sinus tachycardia with heart rate faster than prior ECG but otherwise unchanged.  I have reviewed his initial lab results and my interpretation is moderate leukocytosis which is nonspecific.  I have ordered IV fluids, morphine  for pain, ondansetron  for nausea.  I have ordered CT of abdomen and pelvis to evaluate for bowel obstruction.  I have reviewed his past records and note hospitalization 07/05/2015 for small bowel obstruction.  Repeat vital signs show normalization of heart rate.  CT of abdomen and pelvis shows evidence of small bowel obstruction with no abrupt transition zone.  I have independently viewed the images, and agree with the radiologist interpretation.  Have discussed case with Dr. Sundil of [facility] hospitalist who agrees to admit the patient.  Of note, patient states that pain and nausea are much improved following above-noted treatment.      Final diagnoses:  Small bowel obstruction (HCC)  Elevated random blood glucose level    ED Discharge Orders     None          Raford Lenis, MD 07/19/23 [phone]

## 2023-07-19 NOTE — Consult Note (Signed)
 Douglas Holmes  Apr 25, 1945 [id number]  CARE TEAM:  PCP: Clarice Nottingham, MD  Outpatient Care Team: Patient Care Team: Clarice Nottingham, MD as PCP - General (Internal Medicine) Anner Alm ORN, MD as PCP - Cardiology (Cardiology)  Inpatient Treatment Team: Treatment Team:  Raford Alm, MD Warnell Altha SAILOR, NT Shona Roel LABOR, Paramedic Bobbette Hercules, MD Ccs, Md, MD   This patient is a 78 y.o.male who presents today for surgical evaluation at the request of Dr Orson.   Chief complaint / Reason for evaluation: Recurrent SBO  78 year old male with history of numerous abdominal surgeries.  Partial colectomy for diverticulitis 1988 in [place].  Has had at least 2 SBO episodes.   Had a bowel obstruction trying lysis of adhesions by laparotomy in 2007.  Relocated to [facility] in 2017.  Open right inguinal hernia repair by Dr. Eletha with our group in 2020.  Patient with crampiness bloating and abdominal pain concern for recurrent bowel obstructions since he has had these before.  The abdominal pain is primarily been in the upper abdomen.  Getting nauseated.  Starting to vomit.  Has been obstipated for over 24 hours.  Came to the emergency room at [facility].  Workup and CAT scan suspicious for bowel obstruction.  Surgical consultation requested.  Patient has history of diverticulitis as noted above, bladder cancer/BPH.  Hyperlipidemia.  Some atherosclerotic disease of heart and aorta.   Assessment  Douglas Holmes  78 y.o. male       Problem List:  Active Problems:   * No active hospital problems. *   Distended bowel loops suspicious for bowel obstruction with history of numerous abdominal surgeries and at least 1 prior laparotomy for bowel obstruction in the past  Plan:  Medical admission.  Small bowel protocol  NG tube to low remittent wall suction.  IV fluid resuscitation.    Nausea and pain control.     Hopefully will improve quickly and will not require  repeat operation.  There is no hard indication for emergent surgery at this time.Surgery will help follow.   -monitor electrolytes & replace as needed  Keep K>4, Mg>2, Phos>3  -VTE prophylaxis- SCDs.  Anticoagulation prophyllaxis SQ as appropriate  -mobilize as tolerated to help recovery.  Enlist therapies in moderate/high risk patients as appropriate    I reviewed nursing notes, ED provider notes, last 24 h vitals and pain scores, last 48 h intake and output, last 24 h labs and trends, and last 24 h imaging results. I have reviewed this patient's available data, including medical history, events of note, test results, etc as part of my evaluation.  A significant portion of that time was spent in counseling.  Care during the described time interval was provided by me.  This care required moderate level of medical decision making.  07/19/2023  Elspeth KYM Schultze, MD, FACS, MASCRS Esophageal, Gastrointestinal & Colorectal Surgery Robotic and Minimally Invasive Surgery  [facility] [address], [address] [place], [place] [address] [phone] Fax [phone] Main  CONTACT INFORMATION: Weekday (9AM-5PM): Call [facility] main office at [phone] Weeknight (5PM-9AM) or Weekend/Holiday: Check EPIC Web Links tab & use AMION (password  [email or web address]) for General Surgery [facility] coverage  Please, DO NOT use SecureChat  (it is not reliable communication to reach operating surgeons & will lead to a delay in care).   Epic staff messaging available for outptient concerns needing 1-2 business day response.  07/19/2023      Past Medical History:  Diagnosis Date   Bladder mass    BPH (benign prostatic hypertrophy)    Diverticulosis of colon    ED (erectile dysfunction)    Focal dystonia NEUROLOGIST-  DR TRUE MAR   RIGHT HAND AND ARM   Generalized OA    FINGERS   History of bladder cancer urologist--  dr ceil   05/ 2014  single  papillary (Ta G1) s/p TURBT and 11/ 2015 s/p TURBT   History of diverticulitis    History of kidney stones    Hx SBO    2007 s/p  colon resection;  2017 medically management   Hyperlipidemia    Nocturia    Wears glasses    Wears hearing aid in both ears     Past Surgical History:  Procedure Laterality Date   CYSTOSCOPY WITH BIOPSY  01/22/2020   Procedure: CYSTOSCOPY WITH BLADDER  BIOPSY;  Surgeon: Selma Donnice SAUNDERS, MD;  Location: [facility] ;  Service: Urology;;   EXPLORATORY LAPAROTOMY W/ BOWEL RESECTION  11/ 2007   SBO   GREEN LIGHT LASER TURP (TRANSURETHRAL RESECTION OF PROSTATE  2004   HAND SURGERY  2016   sutures where pt fell and cut hand   INGUINAL HERNIA REPAIR Right 07/04/2018   Procedure: OPEN RIGHT INGUINAL HERNIA REPAIR WITH MESH;  Surgeon: Eletha Boas, MD;  Location: [facility];  Service: General;  Laterality: Right;   LEFT URETEROSCOPIC STONE EXTRACTION  3/ 2013   PARTIAL COLECTOMY  1988   diverticulitis   TRANSURETHRAL RESECTION OF BLADDER TUMOR N/A 11/20/2013   Procedure: TRANSURETHRAL RESECTION OF BLADDER TUMOR (TURBT);  Surgeon: Mark C Ottelin, MD;  Location: [facility];  Service: Urology;  Laterality: N/A;   TRANSURETHRAL RESECTION OF BLADDER TUMOR N/A 12/23/2020   Procedure: TRANSURETHRAL RESECTION OF BLADDER TUMOR (TURBT)/ BLADDER BIOPSY;  Surgeon: Selma Donnice SAUNDERS, MD;  Location: [facility];  Service: Urology;  Laterality: N/A;   TRANSURETHRAL RESECTION OF PROSTATE  5/ 2014   and Resection Bladder Tumor    Social History   Socioeconomic History   Marital status: Married    Spouse name: Not on file   Number of children: Not on file   Years of education: Not on file   Highest education level: Not on file  Occupational History   Not on file  Tobacco Use   Smoking status: Former    Current packs/day: 0.00    Average packs/day: 1 pack/day for 30.0 years (30.0 ttl pk-yrs)    Types: Cigarettes    Start date: 11/20/1963    Quit date:  11/19/1993    Years since quitting: 29.6   Smokeless tobacco: Never  Vaping Use   Vaping status: Never Used  Substance and Sexual Activity   Alcohol use: Yes    Alcohol/week: 3.0 standard drinks of alcohol    Types: 3 Glasses of wine per week   Drug use: Never   Sexual activity: Not on file    Comment: vasectomy   1999  Other Topics Concern   Not on file  Social History Narrative   Not on file   Social Drivers of Health   Financial Resource Strain: Not on file  Food Insecurity: Not on file  Transportation Needs: Not on file  Physical Activity: Not on file  Stress: Not on file  Social Connections: Not on file  Intimate Partner Violence: Not on file    Family History  Problem  Relation Age of Onset   Dementia Father    Cancer Mother    Hypertension Mother    Colon cancer Other        Family hx   Heart failure Brother    Coronary artery disease Brother     Current Facility-Administered Medications  Medication Dose Route Frequency Provider Last Rate Last Admin   acetaminophen  (TYLENOL ) suppository 650 mg  650 mg Rectal Q6H PRN Sheldon Standing, MD       alum & mag hydroxide-simeth (MAALOX/MYLANTA) 200-200-20 MG/5ML suspension 30 mL  30 mL Oral Q6H PRN Sheldon Standing, MD       bisacodyl  (DULCOLAX) suppository 10 mg  10 mg Rectal Daily Sheldon Standing, MD       diatrizoate  meglumine -sodium (GASTROGRAFIN ) 66-10 % solution 90 mL  90 mL Per NG tube Once Sheldon Standing, MD       diphenhydrAMINE  (BENADRYL ) injection 12.5-25 mg  12.5-25 mg Intravenous Q6H PRN Sheldon Standing, MD       fentaNYL  (SUBLIMAZE ) injection 25-50 mcg  25-50 mcg Intravenous Q1H PRN Sheldon Standing, MD       lactated ringers  bolus 1,000 mL  1,000 mL Intravenous Once Sheldon Standing, MD       lactated ringers  bolus 1,000 mL  1,000 mL Intravenous Q8H PRN Sheldon Standing, MD       magic mouthwash  15 mL Oral QID PRN Sheldon Standing, MD       menthol -cetylpyridinium (CEPACOL) lozenge 3 mg  1 lozenge Oral PRN Sheldon Standing, MD        methocarbamol  (ROBAXIN ) injection 1,000 mg  1,000 mg Intravenous Q6H PRN Sheldon Standing, MD       naphazoline-glycerin  (CLEAR EYES REDNESS) ophth solution 1-2 drop  1-2 drop Both Eyes QID PRN Sheldon Standing, MD       ondansetron  (ZOFRAN ) injection 4 mg  4 mg Intravenous Q6H PRN Sheldon Standing, MD       phenol (CHLORASEPTIC) mouth spray 2 spray  2 spray Mouth/Throat PRN Sheldon Standing, MD       prochlorperazine  (COMPAZINE ) injection 5-10 mg  5-10 mg Intravenous Q4H PRN Sheldon Standing, MD       simethicone  (MYLICON) 40 MG/0.6ML suspension 80 mg  80 mg Oral QID PRN Sheldon Standing, MD       sodium chloride  (OCEAN) 0.65 % nasal spray 1-2 spray  1-2 spray Each Nare Q6H PRN Sheldon Standing, MD       Current Outpatient Medications  Medication Sig Dispense Refill   aspirin EC 81 MG tablet Take 81 mg by mouth daily.     Boswellia-Glucosamine-Vit D (OSTEO BI-FLEX ONE PER DAY) TABS Take 1 tablet by mouth daily.     Dentifrices (FLUORIDE TOOTHPASTE DT) Place onto teeth daily.     ezetimibe (ZETIA) 10 MG tablet Take 10 mg by mouth daily.     Misc Natural Products (NEURIVA PO) Take by mouth daily.     Multiple Vitamin (MULTIVITAMIN WITH MINERALS) TABS tablet Take 1 tablet by mouth daily.     rosuvastatin (CRESTOR) 20 MG tablet Take 20 mg by mouth daily.     tamsulosin (FLOMAX) 0.4 MG CAPS capsule Take 0.4 mg by mouth daily.       No Known Allergies  ROS:   All other systems reviewed & are negative except per HPI or as noted below: Constitutional:  No fevers, chills, sweats.  Weight stable Eyes:  No vision changes, No discharge HENT:  No sore throats, nasal drainage Lymph: No neck swelling,  No bruising easily Pulmonary:  No cough, productive sputum CV: No orthopnea, PND  Patient walks 20 minutes without difficulty.  No exertional chest/neck/shoulder/arm pain.  GI:  No personal nor family history of GI/colon cancer, inflammatory bowel disease, irritable bowel syndrome, allergy such as Celiac Sprue,  dietary/dairy problems, colitis, ulcers nor gastritis.  No recent sick contacts/gastroenteritis.  No travel outside the country.  No changes in diet.  Renal: No UTIs, No hematuria Genital:  No drainage, bleeding, masses Musculoskeletal: No severe joint pain.  Good ROM major joints Skin:  No sores or lesions Heme/Lymph:  No easy bleeding.  No swollen lymph nodes   BP 128/84   Pulse 94   Temp (!) 97.5 F (36.4 C) (Oral)   Resp 19   Ht 6' 1 (1.854 m)   Wt 105.8 kg   SpO2 93%   BMI 30.77 kg/m      Results:   Labs: Results for orders placed or performed during the hospital encounter of 07/19/23 (from the past 48 hours)  Lipase, blood     Status: None   Collection Time: 07/19/23  3:02 AM  Result Value Ref Range   Lipase 33 11 - 51 U/L    Comment: Performed at [facility], [address], [place], [place] [address]  Comprehensive metabolic panel     Status: Abnormal   Collection Time: 07/19/23  3:02 AM  Result Value Ref Range   Sodium 136 135 - 145 mmol/L   Potassium 3.7 3.5 - 5.1 mmol/L   Chloride 100 98 - 111 mmol/L   CO2 19 (L) 22 - 32 mmol/L   Glucose, Bld 141 (H) 70 - 99 mg/dL    Comment: Glucose reference range applies only to samples taken after fasting for at least 8 hours.   BUN 19 8 - 23 mg/dL   Creatinine, Ser 9.09 0.61 - 1.24 mg/dL   Calcium 9.6 8.9 - 89.6 mg/dL   Total Protein 7.7 6.5 - 8.1 g/dL   Albumin 4.2 3.5 - 5.0 g/dL   AST 20 15 - 41 U/L   ALT 17 0 - 44 U/L   Alkaline Phosphatase 71 38 - 126 U/L   Total Bilirubin 1.6 (H) 0.0 - 1.2 mg/dL   GFR, Estimated >39 >39 mL/min    Comment: (NOTE) Calculated using the CKD-EPI Creatinine Equation (2021)    Anion gap 17 (H) 5 - 15    Comment: Performed at [facility], [address], [place], [place] [address]  CBC     Status: Abnormal   Collection Time: 07/19/23  3:02 AM  Result Value Ref Range   WBC 15.0 (H) 4.0 - 10.5 K/uL   RBC 5.57 4.22 - 5.81 MIL/uL    Hemoglobin 16.8 13.0 - 17.0 g/dL   HCT 51.1 60.9 - 47.9 %   MCV 87.6 80.0 - 100.0 fL   MCH 30.2 26.0 - 34.0 pg   MCHC 34.4 30.0 - 36.0 g/dL   RDW 86.0 88.4 - 84.4 %   Platelets 341 150 - 400 K/uL   nRBC 0.0 0.0 - 0.2 %    Comment: Performed at [facility], [address] [place], [place] [address]    Imaging / Studies: US  FNA BX THYROID  1ST LESION AFIRMA Result Date: 07/05/2023 INDICATION: Nodule # 6: Left Inferior gland measures 2.6 x 2.6 x 2.1 cm. Left inferior thyroid  nodule. 2.6 cm EXAM: ULTRASOUND GUIDED FINE NEEDLE ASPIRATION OF INDETERMINATE THYROID  NODULE COMPARISON:  US   Thyroid  06/06/23. MEDICATIONS: 8 cc 1% lidocaine  COMPLICATIONS: None immediate. TECHNIQUE: Informed written consent was obtained from the patient after a discussion of the risks, benefits and alternatives to treatment. Questions regarding the procedure were encouraged and answered. A timeout was performed prior to the initiation of the procedure. Pre-procedural ultrasound scanning demonstrated unchanged size and appearance of the indeterminate nodule within the ; left thyroid  The procedure was planned. The neck was prepped in the usual sterile fashion, and a sterile drape was applied covering the operative field. A timeout was performed prior to the initiation of the procedure. Local anesthesia was provided with 1% lidocaine . Under direct ultrasound guidance, 5 FNA biopsies were performed of the left inferior thyroid  nodule with a 25 gauge needle. 2 of these samples were obtained for [facility] Multiple ultrasound images were saved for procedural documentation purposes. The samples were prepared and submitted to pathology. Limited post procedural scanning was negative for hematoma or additional complication. Dressings were placed. The patient tolerated the above procedures procedure well without immediate postprocedural complication. FINDINGS: Nodule reference number based on prior diagnostic ultrasound: 6  Maximum size: 2.6 cm Location: Left; Inferior ACR TI-RADS risk category: TR3 (3 points) Reason for biopsy: meets ACR TI-RADS criteria Ultrasound imaging confirms appropriate placement of the needles within the thyroid  nodule. IMPRESSION: Successful ultrasound guided FNA biopsy of a 2.6 cm LEFT inferior TR-3 thyroid  nodule Performed by Sharlet DELENA Candle [facility] Electronically Signed   By: Thom Hall M.D.   On: 07/05/2023 16:44    Medications / Allergies: per chart  Antibiotics: Anti-infectives (From admission, onward)    None         Note: Portions of this report may have been transcribed using voice recognition software. Every effort was made to ensure accuracy; however, inadvertent computerized transcription errors may be present.   Any transcriptional errors that result from this process are unintentional.    Elspeth KYM Schultze, MD, FACS, MASCRS Esophageal, Gastrointestinal & Colorectal Surgery Robotic and Minimally Invasive Surgery  [facility] [address], [address] [place], [place] [address] [phone] Fax [phone] Main  CONTACT INFORMATION: Weekday (9AM-5PM): Call [facility] main office at [phone] Weeknight (5PM-9AM) or Weekend/Holiday: Check EPIC Web Links tab & use AMION (password  [email or web address]) for General Surgery [facility] coverage  Please, DO NOT use SecureChat  (it is not reliable communication to reach operating surgeons & will lead to a delay in care).   Epic staff messaging available for outptient concerns needing 1-2 business day response.       07/19/2023  5:57 AM

## 2023-07-19 NOTE — ED Triage Notes (Signed)
 Pt reports generalized abd pain and vomiting x2 days. Pt reports h/x SBO. Pt states that this feels similar.

## 2023-07-19 NOTE — Progress Notes (Signed)
 PROGRESS NOTE    Douglas Holmes  FMW:[id number] DOB: [birth year] DOA: 07/19/2023 PCP: Clarice Nottingham, MD   Brief Narrative:  78 year old man history of diverticulosis, hyperlipidemia, bladder cancer and BPH presented to emergency department complaining of abdominal pain, nausea vomiting for 2 days.  Patient reported that he has history of SBO and feels like he has another episode of SBO.  Patient reported he did not have any bowel movement or flatus since 2 days.  Reported abdomen it is slightly distended as compared to his baseline. At presentation to ED patient is hemodynamically stable. CBC showing leukocytosis 15. Normal lipase level. CT abdomen pelvis shows SBO without any transition point.  Also nonobstructing left renal stones measuring up to 14 mm.  As well as T12 and L1 compression fracture.  Patient admitted to hospitalist service, general surgery consulted.  Assessment & Plan:   Principal Problem:   Small bowel obstruction (HCC) Active Problems:   SBO (small bowel obstruction) (HCC)   History of bladder cancer   BPH (benign prostatic hyperplasia)   Hyperlipidemia   QT prolongation   History of SBO as well as diverticular surgery in 2007, now admitted with SBO, POA: Diagnosed clinically and based on the CT scans.  Patient feels better.  No more abdominal pain or nausea but still not passing flatus.  General surgery on board.  He has NG tube.  Continue conservative management for now and follow recommendations from general surgery.   Hyperlipidemia BPH -Hold oral medication in the setting of SBO  Leukocytosis: Likely reactive/nonspecific.  No signs of infection on CT abdomen.  CBC in AM.  DVT prophylaxis: heparin  injection 5,000 Units Start: 07/19/23 1400 SCDs Start: 07/19/23 0603 Place TED hose Start: 07/19/23 0603   Code Status: Full Code  Family Communication:  None present at bedside.  Plan of care discussed with patient in length and he/she verbalized understanding and  agreed with it.  Status is: Inpatient Remains inpatient appropriate because: Improving SBO   Estimated body mass index is 30.77 kg/m as calculated from the following:   Height as of this encounter: 6' 1 (1.854 m).   Weight as of this encounter: 105.8 kg.    Nutritional Assessment: Body mass index is 30.77 kg/m.SABRA Seen by dietician.  I agree with the assessment and plan as outlined below: Nutrition Status:        . Skin Assessment: I have examined the patient's skin and I agree with the wound assessment as performed by the wound care RN as outlined below:    Consultants:  General surgery  Procedures:  As above  Antimicrobials:  Anti-infectives (From admission, onward)    None         Subjective: Seen and examined, feels much better than yesterday.  Still not passing flatus.  Objective: Vitals:   07/19/23 0412 07/19/23 0525 07/19/23 0730 07/19/23 0802  BP: (!) 146/91 128/84 129/83 (!) 137/93  Pulse: 95 94 84 90  Resp: 20 19 18 18   Temp: (!) 97.5 F (36.4 C)   97.9 F (36.6 C)  TempSrc: Oral     SpO2: 93% 93% 94% 97%  Weight:      Height:        Intake/Output Summary (Last 24 hours) at 07/19/2023 0823 Last data filed at 07/19/2023 0746 Gross per 24 hour  Intake 1000 ml  Output --  Net 1000 ml   Filed Weights   07/19/23 0252  Weight: 105.8 kg    Examination:  General exam: Appears calm  and comfortable  Respiratory system: Clear to auscultation. Respiratory effort normal. Cardiovascular system: S1 & S2 heard, RRR. No JVD, murmurs, rubs, gallops or clicks. No pedal edema. Gastrointestinal system: Abdomen is nondistended, soft and nontender. No organomegaly or masses felt. Normal bowel sounds heard but slightly sluggish. Central nervous system: Alert and oriented. No focal neurological deficits. Extremities: Symmetric 5 x 5 power. Skin: No rashes, lesions or ulcers Psychiatry: Judgement and insight appear normal. Mood & affect appropriate.     Data Reviewed: I have personally reviewed following labs and imaging studies  CBC: Recent Labs  Lab 07/19/23 0302  WBC 15.0*  HGB 16.8  HCT 48.8  MCV 87.6  PLT 341   Basic Metabolic Panel: Recent Labs  Lab 07/19/23 0302  NA 136  K 3.7  CL 100  CO2 19*  GLUCOSE 141*  BUN 19  CREATININE 0.90  CALCIUM 9.6   GFR: Estimated Creatinine Clearance: 86.4 mL/min (by C-G formula based on SCr of 0.9 mg/dL). Liver Function Tests: Recent Labs  Lab 07/19/23 0302  AST 20  ALT 17  ALKPHOS 71  BILITOT 1.6*  PROT 7.7  ALBUMIN 4.2   Recent Labs  Lab 07/19/23 0302  LIPASE 33   No results for input(s): AMMONIA in the last 168 hours. Coagulation Profile: No results for input(s): INR, PROTIME in the last 168 hours. Cardiac Enzymes: No results for input(s): CKTOTAL, CKMB, CKMBINDEX, TROPONINI in the last 168 hours. BNP (last 3 results) No results for input(s): PROBNP in the last 8760 hours. HbA1C: No results for input(s): HGBA1C in the last 72 hours. CBG: No results for input(s): GLUCAP in the last 168 hours. Lipid Profile: No results for input(s): CHOL, HDL, LDLCALC, TRIG, CHOLHDL, LDLDIRECT in the last 72 hours. Thyroid  Function Tests: No results for input(s): TSH, T4TOTAL, FREET4, T3FREE, THYROIDAB in the last 72 hours. Anemia Panel: No results for input(s): VITAMINB12, FOLATE, FERRITIN, TIBC, IRON, RETICCTPCT in the last 72 hours. Sepsis Labs: No results for input(s): PROCALCITON, LATICACIDVEN in the last 168 hours.  No results found for this or any previous visit (from the past 240 hours).   Radiology Studies: DG Abd Portable 1V-Small Bowel Protocol-Position Verification Result Date: 07/19/2023 CLINICAL DATA:  78 year old male enteric tube placement. EXAM: PORTABLE ABDOMEN - 1 VIEW COMPARISON:  CT Abdomen and Pelvis 0511 hours today. FINDINGS: Portable AP semi upright view at 0701 hours. Enteric tube courses  through the mediastinum and terminates in the epigastrium, side hole visible at the level of the gastroesophageal junction when comparing to earlier coronal CT images. Stable bowel gas pattern from that scout view. Stable lung bases. IMPRESSION: Enteric tube placed just inside the stomach, side hole may still be at the GEJ. Recommend advancing 5 cm to ensure side hole placement distal to the GEJ. Electronically Signed   By: VEAR Hurst M.D.   On: 07/19/2023 07:33   CT ABDOMEN PELVIS W CONTRAST Result Date: 07/19/2023 CLINICAL DATA:  Abdominal pain and vomiting for 2 days. History of small-bowel obstruction. EXAM: CT ABDOMEN AND PELVIS WITH CONTRAST TECHNIQUE: Multidetector CT imaging of the abdomen and pelvis was performed using the standard protocol following bolus administration of intravenous contrast. RADIATION DOSE REDUCTION: This exam was performed according to the departmental dose-optimization program which includes automated exposure control, adjustment of the mA and/or kV according to patient size and/or use of iterative reconstruction technique. CONTRAST:  OMNIPAQUE  IOHEXOL  300 MG/ML  SOLN COMPARISON:  10/26/2022 FINDINGS: Lower chest: No acute findings. Hepatobiliary: No suspicious focal  abnormality within the liver parenchyma. There is no evidence for gallstones, gallbladder wall thickening, or pericholecystic fluid. No intrahepatic or extrahepatic biliary dilation. Pancreas: No focal mass lesion. No dilatation of the main duct. No intraparenchymal cyst. No peripancreatic edema. Spleen: No splenomegaly. No suspicious focal mass lesion. Adrenals/Urinary Tract: No adrenal nodule or mass. Right kidney unremarkable. Nonobstructing stones in the lower pole left kidney measure up to 14 mm. 2.9 cm simple cyst noted lower pole left kidney. No followup imaging is recommended. No evidence for hydroureter. The urinary bladder appears normal for the degree of distention. Stomach/Bowel: Stomach is distended with  fluid. Duodenum is normally positioned as is the ligament of Treitz. Proximal small bowel loops are fluid-filled and dilated up to 4.2 cm diameter. While no abrupt transition zone is evident, there is small bowel in the lower right abdomen and upper pelvis that demonstrates fecalization of enteric contents and subtle perienteric edema (see axial 53/2 and coronal 82/7. This abnormal loop then narrows as it tracks anteriorly towards the supraumbilical region and becomes completely decompressed in the midline upper abdomen. Distal ileal loops of the right lower quadrant a completely decompressed into the terminal ileum. The appendix is not well visualized, but there is no edema or inflammation in the region of the cecal tip to suggest appendicitis. No gross colonic mass. No colonic wall thickening. Diverticuli are seen scattered along the entire length of the colon without CT findings of diverticulitis. Vascular/Lymphatic: There is moderate atherosclerotic calcification of the abdominal aorta without aneurysm. There is no gastrohepatic or hepatoduodenal ligament lymphadenopathy. No retroperitoneal or mesenteric lymphadenopathy. No pelvic sidewall lymphadenopathy. Reproductive: Prostate gland is enlarged. Other: No intraperitoneal free fluid. Musculoskeletal: No worrisome lytic or sclerotic osseous abnormality. Superior endplate compression deformity at L1 is new in the interval since 10/26/2022 as nonacute features by CT imaging. Subtle superior endplate compression deformity at T12 is also new in the interval. IMPRESSION: 1. Proximal small bowel loops are fluid-filled and dilated up to 4.2 cm diameter. While no abrupt transition zone is evident, there is small bowel in the lower right abdomen and upper pelvis with fecalization of enteric contents in perienteric edema. Ileal loops of the right lower quadrant completely decompressed into the ileocecal valve. Imaging features are compatible with small-bowel obstruction.  No evidence for small bowel wall thickening or pneumatosis. 2. Colonic diverticulosis without diverticulitis. 3. Nonobstructing left renal stones measure up to 14 mm. 4. Superior endplate compression deformities at T12 and L1 are new in the interval since 10/26/2022. 5.  Aortic Atherosclerosis (ICD10-I70.0). Electronically Signed   By: Camellia Candle M.D.   On: 07/19/2023 06:36    Scheduled Meds:  bisacodyl   10 mg Rectal Daily   diatrizoate  meglumine -sodium  90 mL Per NG tube Once   heparin   5,000 Units Subcutaneous Q8H   sodium chloride  flush  3 mL Intravenous Q12H   sodium chloride  flush  3 mL Intravenous Q12H   Continuous Infusions:  sodium chloride      lactated ringers      lactated ringers  Stopped (07/19/23 0746)     LOS: 0 days   Fredia Skeeter, MD [facility]  07/19/2023, 8:23 AM   *Please note that this is a verbal dictation therefore any spelling or grammatical errors are due to the Dragon Medical One system interpretation.  Please page via Amion and do not message via secure chat for urgent patient care matters. Secure chat can be used for non urgent patient care matters.  How to contact the [facility] Attending  or Consulting provider 7A - 7P or covering provider during after hours 7P -7A, for this patient?  Check the care team in [facility] and look for a) attending/consulting [facility] provider listed and b) the [facility] team listed. Page or secure chat 7A-7P. Log into [email or web address] and use [facility] universal password to access. If you do not have the password, please contact the hospital operator. Locate the [facility] provider you are looking for under [facility] and page to a number that you can be directly reached. If you still have difficulty reaching the provider, please page the [facility] (Director on Call) for the Hospitalists listed on amion for assistance.

## 2023-07-19 NOTE — ED Notes (Signed)
 ED TO INPATIENT HANDOFF REPORT  Name/Age/Gender Douglas Holmes 78 y.o. male  Code Status    Code Status Orders  (From admission, onward)           Start     Ordered   07/19/23 0601  Full code  Continuous       Question:  By:  Answer:  Consent: discussion documented in EHR   07/19/23 0602           Code Status History     Date Active Date Inactive Code Status Order ID Comments User Context   07/05/2015 2143 07/07/2015 1740 Full Code [id number]  Franchot Carrier, MD Inpatient       Home/SNF/Other Home  Chief Complaint Small bowel obstruction [facility]) [K56.609]  Level of Care/Admitting Diagnosis ED Disposition     ED Disposition  Admit   Condition  --   Comment  Hospital Area: [facility] [[id number]]  Level of Care: Telemetry [5]  Admit to tele based on following criteria: Other see comments  Comments: Monitor for arrhythmia  May admit patient to [facility] or [facility] if equivalent level of care is available:: No  Covid Evaluation: Asymptomatic - no recent exposure (last 10 days) testing not required  Diagnosis: Small bowel obstruction [facility]) [[id number]]  Admitting Physician: SUNDIL, SUBRINA [[id number]]  Attending Physician: SUNDIL, SUBRINA [[id number]]  Certification:: I certify this patient will need inpatient services for at least 2 midnights  Expected Medical Readiness: 07/24/2023          Medical History Past Medical History:  Diagnosis Date   Bladder mass    BPH (benign prostatic hypertrophy)    Diverticulosis of colon    ED (erectile dysfunction)    Focal dystonia NEUROLOGIST-  DR TRUE MAR   RIGHT HAND AND ARM   Generalized OA    FINGERS   History of bladder cancer urologist--  dr ceil   05/ 2014  single papillary (Ta G1) s/p TURBT and 11/ 2015 s/p TURBT   History of diverticulitis    History of kidney stones    Hx SBO    2007 s/p  colon resection;  2017 medically management   Hyperlipidemia    Nocturia    Wears glasses     Wears hearing aid in both ears     Allergies No Known Allergies  IV Location/Drains/Wounds Patient Lines/Drains/Airways Status     Active Line/Drains/Airways     Name Placement date Placement time Site Days   Peripheral IV 07/19/23 20 G 1 Anterior;Distal;Left;Upper Arm 07/19/23  0418  Arm  less than 1   NG/OG Vented/Dual Lumen 16 Fr. Right nare Marking at nare/corner of mouth 61 cm 07/19/23  0649  Right nare  less than 1            Labs/Imaging Results for orders placed or performed during the hospital encounter of 07/19/23 (from the past 48 hours)  Lipase, blood     Status: None   Collection Time: 07/19/23  3:02 AM  Result Value Ref Range   Lipase 33 11 - 51 U/L    Comment: Performed at [facility], [address], [place], [place] [address]  Comprehensive metabolic panel     Status: Abnormal   Collection Time: 07/19/23  3:02 AM  Result Value Ref Range   Sodium 136 135 - 145 mmol/L   Potassium 3.7 3.5 - 5.1 mmol/L   Chloride 100 98 - 111 mmol/L   CO2 19 (L)  22 - 32 mmol/L   Glucose, Bld 141 (H) 70 - 99 mg/dL    Comment: Glucose reference range applies only to samples taken after fasting for at least 8 hours.   BUN 19 8 - 23 mg/dL   Creatinine, Ser 9.09 0.61 - 1.24 mg/dL   Calcium 9.6 8.9 - 89.6 mg/dL   Total Protein 7.7 6.5 - 8.1 g/dL   Albumin 4.2 3.5 - 5.0 g/dL   AST 20 15 - 41 U/L   ALT 17 0 - 44 U/L   Alkaline Phosphatase 71 38 - 126 U/L   Total Bilirubin 1.6 (H) 0.0 - 1.2 mg/dL   GFR, Estimated >39 >39 mL/min    Comment: (NOTE) Calculated using the CKD-EPI Creatinine Equation (2021)    Anion gap 17 (H) 5 - 15    Comment: Performed at [facility], [address], [place], [place] [address]  CBC     Status: Abnormal   Collection Time: 07/19/23  3:02 AM  Result Value Ref Range   WBC 15.0 (H) 4.0 - 10.5 K/uL   RBC 5.57 4.22 - 5.81 MIL/uL   Hemoglobin 16.8 13.0 - 17.0 g/dL   HCT 51.1 60.9 - 47.9 %   MCV 87.6 80.0  - 100.0 fL   MCH 30.2 26.0 - 34.0 pg   MCHC 34.4 30.0 - 36.0 g/dL   RDW 86.0 88.4 - 84.4 %   Platelets 341 150 - 400 K/uL   nRBC 0.0 0.0 - 0.2 %    Comment: Performed at [facility], [address], [place], [place] [address]   CT ABDOMEN PELVIS W CONTRAST Result Date: 07/19/2023 CLINICAL DATA:  Abdominal pain and vomiting for 2 days. History of small-bowel obstruction. EXAM: CT ABDOMEN AND PELVIS WITH CONTRAST TECHNIQUE: Multidetector CT imaging of the abdomen and pelvis was performed using the standard protocol following bolus administration of intravenous contrast. RADIATION DOSE REDUCTION: This exam was performed according to the departmental dose-optimization program which includes automated exposure control, adjustment of the mA and/or kV according to patient size and/or use of iterative reconstruction technique. CONTRAST:  OMNIPAQUE  IOHEXOL  300 MG/ML  SOLN COMPARISON:  10/26/2022 FINDINGS: Lower chest: No acute findings. Hepatobiliary: No suspicious focal abnormality within the liver parenchyma. There is no evidence for gallstones, gallbladder wall thickening, or pericholecystic fluid. No intrahepatic or extrahepatic biliary dilation. Pancreas: No focal mass lesion. No dilatation of the main duct. No intraparenchymal cyst. No peripancreatic edema. Spleen: No splenomegaly. No suspicious focal mass lesion. Adrenals/Urinary Tract: No adrenal nodule or mass. Right kidney unremarkable. Nonobstructing stones in the lower pole left kidney measure up to 14 mm. 2.9 cm simple cyst noted lower pole left kidney. No followup imaging is recommended. No evidence for hydroureter. The urinary bladder appears normal for the degree of distention. Stomach/Bowel: Stomach is distended with fluid. Duodenum is normally positioned as is the ligament of Treitz. Proximal small bowel loops are fluid-filled and dilated up to 4.2 cm diameter. While no abrupt transition zone is evident, there is small  bowel in the lower right abdomen and upper pelvis that demonstrates fecalization of enteric contents and subtle perienteric edema (see axial 53/2 and coronal 82/7. This abnormal loop then narrows as it tracks anteriorly towards the supraumbilical region and becomes completely decompressed in the midline upper abdomen. Distal ileal loops of the right lower quadrant a completely decompressed into the terminal ileum. The appendix is not well visualized, but there is no edema or inflammation in the  region of the cecal tip to suggest appendicitis. No gross colonic mass. No colonic wall thickening. Diverticuli are seen scattered along the entire length of the colon without CT findings of diverticulitis. Vascular/Lymphatic: There is moderate atherosclerotic calcification of the abdominal aorta without aneurysm. There is no gastrohepatic or hepatoduodenal ligament lymphadenopathy. No retroperitoneal or mesenteric lymphadenopathy. No pelvic sidewall lymphadenopathy. Reproductive: Prostate gland is enlarged. Other: No intraperitoneal free fluid. Musculoskeletal: No worrisome lytic or sclerotic osseous abnormality. Superior endplate compression deformity at L1 is new in the interval since 10/26/2022 as nonacute features by CT imaging. Subtle superior endplate compression deformity at T12 is also new in the interval. IMPRESSION: 1. Proximal small bowel loops are fluid-filled and dilated up to 4.2 cm diameter. While no abrupt transition zone is evident, there is small bowel in the lower right abdomen and upper pelvis with fecalization of enteric contents in perienteric edema. Ileal loops of the right lower quadrant completely decompressed into the ileocecal valve. Imaging features are compatible with small-bowel obstruction. No evidence for small bowel wall thickening or pneumatosis. 2. Colonic diverticulosis without diverticulitis. 3. Nonobstructing left renal stones measure up to 14 mm. 4. Superior endplate compression  deformities at T12 and L1 are new in the interval since 10/26/2022. 5.  Aortic Atherosclerosis (ICD10-I70.0). Electronically Signed   By: Camellia Candle M.D.   On: 07/19/2023 06:36    Pending Labs Unresulted Labs (From admission, onward)     Start     Ordered   07/20/23 0500  CBC  Tomorrow morning,   R        07/19/23 0557   07/20/23 0500  Basic metabolic panel with GFR  Tomorrow morning,   R        07/19/23 0557   07/20/23 0500  Comprehensive metabolic panel  Tomorrow morning,   R        07/19/23 0602   07/20/23 0500  CBC  Tomorrow morning,   R        07/19/23 0602   07/19/23 0253  Urinalysis, Routine w reflex microscopic -Urine, Clean Catch  Once,   URGENT       Question:  Specimen Source  Answer:  Urine, Clean Catch   07/19/23 0253            Vitals/Pain Today's Vitals   07/19/23 0412 07/19/23 0420 07/19/23 0511 07/19/23 0525  BP: (!) 146/91   128/84  Pulse: 95   94  Resp: 20   19  Temp: (!) 97.5 F (36.4 C)     TempSrc: Oral     SpO2: 93%   93%  Weight:      Height:      PainSc:  2  0-No pain     Isolation Precautions No active isolations  Medications Medications  diatrizoate  meglumine -sodium (GASTROGRAFIN ) 66-10 % solution 90 mL (has no administration in time range)  lactated ringers  bolus 1,000 mL (has no administration in time range)  methocarbamol  (ROBAXIN ) injection 1,000 mg (has no administration in time range)  ondansetron  (ZOFRAN ) injection 4 mg (has no administration in time range)  prochlorperazine  (COMPAZINE ) injection 5-10 mg (has no administration in time range)  phenol (CHLORASEPTIC) mouth spray 2 spray (has no administration in time range)  menthol -cetylpyridinium (CEPACOL) lozenge 3 mg (has no administration in time range)  magic mouthwash (has no administration in time range)  alum & mag hydroxide-simeth (MAALOX/MYLANTA) 200-200-20 MG/5ML suspension 30 mL (has no administration in time range)  simethicone  (MYLICON) 40 MG/0.6ML suspension 80 mg  (  has no administration in time range)  bisacodyl  (DULCOLAX) suppository 10 mg (has no administration in time range)  diphenhydrAMINE  (BENADRYL ) injection 12.5-25 mg (has no administration in time range)  sodium chloride  (OCEAN) 0.65 % nasal spray 1-2 spray (has no administration in time range)  heparin  injection 5,000 Units (has no administration in time range)  sodium chloride  flush (NS) 0.9 % injection 3 mL (has no administration in time range)  lactated ringers  infusion ( Intravenous New Bag/Given 07/19/23 0626)  sodium chloride  flush (NS) 0.9 % injection 3 mL (has no administration in time range)  sodium chloride  flush (NS) 0.9 % injection 3 mL (has no administration in time range)  0.9 %  sodium chloride  infusion (has no administration in time range)  acetaminophen  (TYLENOL ) tablet 650 mg (has no administration in time range)    Or  acetaminophen  (TYLENOL ) suppository 650 mg (has no administration in time range)  trimethobenzamide  (TIGAN ) injection 200 mg (has no administration in time range)  fentaNYL  (SUBLIMAZE ) injection 25-50 mcg (has no administration in time range)  sodium chloride  0.9 % bolus 1,000 mL (0 mLs Intravenous Stopped 07/19/23 0627)  ondansetron  (ZOFRAN ) injection 4 mg (4 mg Intravenous Given 07/19/23 0420)  morphine  (PF) 4 MG/ML injection 4 mg (4 mg Intravenous Given 07/19/23 0420)  iohexol  (OMNIPAQUE ) 300 MG/ML solution 100 mL (100 mLs Intravenous Contrast Given 07/19/23 0510)  lactated ringers  bolus 1,000 mL (1,000 mLs Intravenous New Bag/Given 07/19/23 0626)    Mobility walks

## 2023-07-19 NOTE — Hospital Course (Signed)
 78 year old man history of diverticulosis, hyperlipidemia, bladder cancer and BPH presented to emergency department complaining of abdominal pain, nausea vomiting for 2 days.  Patient reported that he has history of SBO and feels like he has another episode of SBO.  Patient reported he did not have any bowel movement or flatus.  Reported abdomen it is slightly distended as compared to his baseline. At presentation to ED patient is hemodynamically stable. CBC showing leukocytosis 15. CMP showed low bicarb 19 elevated bilirubin 1.6 elevated gap 17.  Normal lipase level. CT abdomen pelvis has been ordered.  Read by ED physician Dr. Raford stated that patient has a small bowel obstruction however pending official radiology reading.  In the ED patient has been given morphine , Zofran  and 1 L of NS bolus.  Hospitalist has been consulted for further management of small bowel obstruction.

## 2023-07-19 NOTE — ED Notes (Signed)
 Call [phone] when NG tube is placed for confirmation.

## 2023-07-19 NOTE — ED Notes (Signed)
 Patient back from CT.

## 2023-07-19 NOTE — H&P (Addendum)
 History and Physical    Douglas Holmes FMW:[id number] DOB: [birth year] DOA: 07/19/2023  PCP: Clarice Nottingham, MD   Patient coming from: Home   Chief Complaint:  Chief Complaint  Patient presents with   Abdominal Pain   ED TRIAGE note:  Pt reports generalized abd pain and vomiting x2 days. Pt reports h/x SBO. Pt states that this feels similar.      HPI:  78 year old man history of diverticulosis, hyperlipidemia, bladder cancer and BPH presented to emergency department complaining of abdominal pain, nausea vomiting for 2 days.  Patient reported that he has history of SBO and feels like he has another episode of SBO.  Patient reported he did not have any bowel movement or flatus.  Reported abdomen it is slightly distended as compared to his baseline.  During my evaluation at bedside patient reported that he has history of diverticular surgery 2017 afterward he developed 1 episode of small bowel obstruction which resolves without any surgical intervention. Patient has last bowel movement on Wednesday and unable to pass any flatus since Wednesday.  Has multiple episodes of vomiting at home.  Complaining about mid abdominal pain which is cramping with up-and-down intensity. Denies any fever, chill, diarrhea, hematemesis and melena.  Patient does not have any other complaint at this time.  At presentation to ED patient is hemodynamically stable. CBC showing leukocytosis 15. CMP showed low bicarb 19 elevated bilirubin 1.6 elevated gap 17.  Normal lipase level. CT abdomen pelvis has been ordered.  Read by ED physician Dr. Raford stated that patient has a small bowel obstruction however pending official radiology reading.  In the ED patient has been given morphine , Zofran  and 1 L of NS bolus.  Hospitalist has been consulted for further management of small bowel obstruction.  Spoke with general surgery Dr. Sheldon recommended placing NG tube and initiate a small bowel protocol.  General surgery  will place the order and will see patient soon.  Significant labs in the ED: Lab Orders         Lipase, blood         Comprehensive metabolic panel         CBC         Urinalysis, Routine w reflex microscopic -Urine, Clean Catch         CBC         Basic metabolic panel with GFR         Comprehensive metabolic panel         CBC       Review of Systems:  Review of Systems  Constitutional:  Negative for chills, fever, malaise/fatigue and weight loss.  Gastrointestinal:  Positive for abdominal pain, constipation and vomiting. Negative for blood in stool, diarrhea, heartburn, melena and nausea.  Genitourinary:  Negative for dysuria, flank pain, frequency and urgency.  Neurological:  Negative for dizziness and headaches.  Psychiatric/Behavioral:  The patient is not nervous/anxious.     Past Medical History:  Diagnosis Date   Bladder mass    BPH (benign prostatic hypertrophy)    Diverticulosis of colon    ED (erectile dysfunction)    Focal dystonia NEUROLOGIST-  DR TRUE MAR   RIGHT HAND AND ARM   Generalized OA    FINGERS   History of bladder cancer urologist--  dr ottelin   05/ 2014  single papillary (Ta G1) s/p TURBT and 11/ 2015 s/p TURBT   History of diverticulitis    History of kidney stones  Hx SBO    2007 s/p  colon resection;  2017 medically management   Hyperlipidemia    Nocturia    Wears glasses    Wears hearing aid in both ears     Past Surgical History:  Procedure Laterality Date   CYSTOSCOPY WITH BIOPSY  01/22/2020   Procedure: CYSTOSCOPY WITH BLADDER  BIOPSY;  Surgeon: Selma Donnice SAUNDERS, MD;  Location: [facility];  Service: Urology;;   EXPLORATORY LAPAROTOMY W/ BOWEL RESECTION  11/ 2007   SBO   GREEN LIGHT LASER TURP (TRANSURETHRAL RESECTION OF PROSTATE  2004   HAND SURGERY  2016   sutures where pt fell and cut hand   INGUINAL HERNIA REPAIR Right 07/04/2018   Procedure: OPEN RIGHT INGUINAL HERNIA REPAIR WITH MESH;  Surgeon: Eletha Boas, MD;  Location: [facility];  Service: General;  Laterality: Right;   LEFT URETEROSCOPIC STONE EXTRACTION  3/ 2013   PARTIAL COLECTOMY  1988   diverticulitis   TRANSURETHRAL RESECTION OF BLADDER TUMOR N/A 11/20/2013   Procedure: TRANSURETHRAL RESECTION OF BLADDER TUMOR (TURBT);  Surgeon: Mark C Ottelin, MD;  Location: [facility];  Service: Urology;  Laterality: N/A;   TRANSURETHRAL RESECTION OF BLADDER TUMOR N/A 12/23/2020   Procedure: TRANSURETHRAL RESECTION OF BLADDER TUMOR (TURBT)/ BLADDER BIOPSY;  Surgeon: Selma Donnice SAUNDERS, MD;  Location: [facility];  Service: Urology;  Laterality: N/A;   TRANSURETHRAL RESECTION OF PROSTATE  5/ 2014   and Resection Bladder Tumor     reports that he quit smoking about 29 years ago. His smoking use included cigarettes. He started smoking about 59 years ago. He has a 30 pack-year smoking history. He has never used smokeless tobacco. He reports current alcohol use of about 3.0 standard drinks of alcohol per week. He reports that he does not use drugs.  No Known Allergies  Family History  Problem Relation Age of Onset   Dementia Father    Cancer Mother    Hypertension Mother    Colon cancer Other        Family hx   Heart failure Brother    Coronary artery disease Brother     Prior to Admission medications   Medication Sig Start Date End Date Taking? Authorizing Provider  aspirin EC 81 MG tablet Take 81 mg by mouth daily.    [provider]  Boswellia-Glucosamine-Vit D (OSTEO BI-FLEX ONE PER DAY) TABS Take 1 tablet by mouth daily.    [provider]  Dentifrices (FLUORIDE TOOTHPASTE DT) Place onto teeth daily.    [provider]  ezetimibe (ZETIA) 10 MG tablet Take 10 mg by mouth daily. 04/19/20   [provider]  Misc Natural Products (NEURIVA PO) Take by mouth daily.    [provider]  Multiple Vitamin (MULTIVITAMIN WITH MINERALS) TABS tablet Take 1 tablet by mouth daily.    [provider]  rosuvastatin (CRESTOR) 20 MG tablet Take 20 mg by mouth daily.    [provider]  tamsulosin (FLOMAX) 0.4 MG CAPS capsule Take 0.4 mg by mouth daily. 07/17/22   [provider]     Physical Exam: Vitals:   07/19/23 0251 07/19/23 0252 07/19/23 0412 07/19/23 0525  BP: 103/71  (!) 146/91 128/84  Pulse: (!) 130  95 94  Resp: 19  20 19   Temp: 97.9 F (36.6 C)  (!) 97.5 F (36.4 C)   TempSrc: Oral  Oral   SpO2: 95%  93% 93%  Weight:  105.8 kg  Height:  6' 1 (1.854 m)      Physical Exam Constitutional:      Appearance: He is not ill-appearing.  Cardiovascular:     Rate and Rhythm: Normal rate and regular rhythm.  Abdominal:     General: Abdomen is flat. Bowel sounds are decreased. There is distension.     Palpations: Abdomen is soft.     Tenderness: There is no abdominal tenderness. There is no guarding or rebound.  Skin:    Capillary Refill: Capillary refill takes less than 2 seconds.  Neurological:     Mental Status: He is alert and oriented to person, place, and time.  Psychiatric:        Mood and Affect: Mood normal.      Labs on Admission: I have personally reviewed following labs and imaging studies  CBC: Recent Labs  Lab 07/19/23 0302  WBC 15.0*  HGB 16.8  HCT 48.8  MCV 87.6  PLT 341   Basic Metabolic Panel: Recent Labs  Lab 07/19/23 0302  NA 136  K 3.7  CL 100  CO2 19*  GLUCOSE 141*  BUN 19  CREATININE 0.90  CALCIUM 9.6   GFR: Estimated Creatinine Clearance: 86.4 mL/min (by C-G formula based on SCr of 0.9 mg/dL). Liver Function Tests: Recent Labs  Lab 07/19/23 0302  AST 20  ALT 17  ALKPHOS 71  BILITOT 1.6*  PROT 7.7  ALBUMIN 4.2   Recent Labs  Lab 07/19/23 0302  LIPASE 33   No results for input(s): AMMONIA in the last 168 hours. Coagulation Profile: No results for input(s): INR, PROTIME in the last 168 hours. Cardiac Enzymes: No results for input(s): CKTOTAL, CKMB, CKMBINDEX, TROPONINI,  TROPONINIHS in the last 168 hours. BNP (last 3 results) No results for input(s): BNP in the last 8760 hours. HbA1C: No results for input(s): HGBA1C in the last 72 hours. CBG: No results for input(s): GLUCAP in the last 168 hours. Lipid Profile: No results for input(s): CHOL, HDL, LDLCALC, TRIG, CHOLHDL, LDLDIRECT in the last 72 hours. Thyroid  Function Tests: No results for input(s): TSH, T4TOTAL, FREET4, T3FREE, THYROIDAB in the last 72 hours. Anemia Panel: No results for input(s): VITAMINB12, FOLATE, FERRITIN, TIBC, IRON, RETICCTPCT in the last 72 hours. Urine analysis:    Component Value Date/Time   COLORURINE YELLOW 07/05/2015 2253   APPEARANCEUR CLEAR 07/05/2015 2253   LABSPEC 1.042 (H) 07/05/2015 2253   PHURINE 5.0 07/05/2015 2253   GLUCOSEU NEGATIVE 07/05/2015 2253   HGBUR NEGATIVE 07/05/2015 2253   BILIRUBINUR NEGATIVE 07/05/2015 2253   KETONESUR NEGATIVE 07/05/2015 2253   PROTEINUR NEGATIVE 07/05/2015 2253   NITRITE NEGATIVE 07/05/2015 2253   LEUKOCYTESUR NEGATIVE 07/05/2015 2253    Radiological Exams on Admission: I have personally reviewed images No results found.   EKG: My personal interpretation of EKG shows: Normal sinus rhythm heart rate 79, premature ventricular complex and prolonged QTc.    Assessment/Plan: Principal Problem:   Small bowel obstruction (HCC) Active Problems:   SBO (small bowel obstruction) (HCC)   History of bladder cancer   BPH (benign prostatic hyperplasia)   Hyperlipidemia   QT prolongation    Assessment and Plan: Small bowel obstruction History of SBO History of diverticular surgery 2007 -Presented emergency department complaining of abdominal pain with associated vomiting for last 2 days.  Unable to pass flatus and no bowel movement for 2 days as well.  Also reported abdomen feels distended as compared to baseline. - At presentation to ED patient is hemodynamically stable. -CBC  showing  leukocytosis.  CMP showing low bicarb 19 elevated and 17 otherwise unremarkable.  Normal lipase level. -Physical exam showed mildly distended abdome, soft and nontender on palpation. No evidence of guarding and rigidity. - CT abdomen pelvis showing evidence of a small bowel obstruction. - Consulted and discussed case with general surgery Dr. Sheldon who has been initiated NG tube and small bowel protocol. - Continue maintain NG tube to low intermittent suction. - Continue small bowel protocol. - Keeping patient n.p.o. - In the ED patient received 1 L of NS bolus.  Continue maintenance fluid LR 125 cc/h. - Keep patient NPO.  Continue pain control and nausea control as needed. -Continue serial abdominal exam every 4-6 hours.  Hyperlipidemia BPH -Hold oral medication in the setting of SBO    DVT prophylaxis:  SQ Heparin  Code Status:  Full Code Diet: Keep patient n.p.o. Family Communication:   Family was present at bedside, at the time of interview. Opportunity was given to ask question and all questions were answered satisfactorily.  Disposition Plan: Continue to monitor monitor improvement of SBO Consults: General Surgery Admission status:   Inpatient, Telemetry bed  Severity of Illness: The appropriate patient status for this patient is INPATIENT. Inpatient status is judged to be reasonable and necessary in order to provide the required intensity of service to ensure the patient's safety. The patient's presenting symptoms, physical exam findings, and initial radiographic and laboratory data in the context of their chronic comorbidities is felt to place them at high risk for further clinical deterioration. Furthermore, it is not anticipated that the patient will be medically stable for discharge from the hospital within 2 midnights of admission.   * I certify that at the point of admission it is my clinical judgment that the patient will require inpatient hospital care spanning beyond 2  midnights from the point of admission due to high intensity of service, high risk for further deterioration and high frequency of surveillance required.DEWAINE    Shalay Carder, MD [facility]  How to contact the [facility] Attending or Consulting provider 7A - 7P or covering provider during after hours 7P -7A, for this patient.  Check the care team in [facility] and look for a) attending/consulting [facility] provider listed and b) the [facility] team listed Log into [email or web address] and use [facility] universal password to access. If you do not have the password, please contact the hospital operator. Locate the [facility] provider you are looking for under [facility] and page to a number that you can be directly reached. If you still have difficulty reaching the provider, please page the [facility] (Director on Call) for the Hospitalists listed on amion for assistance.  07/19/2023, 6:26 AM

## 2023-07-20 ENCOUNTER — Inpatient Hospital Stay (HOSPITAL_COMMUNITY)

## 2023-07-20 DIAGNOSIS — K56609 Unspecified intestinal obstruction, unspecified as to partial versus complete obstruction: Secondary | ICD-10-CM | POA: Diagnosis not present

## 2023-07-20 LAB — CBC
HCT: 41.2 % (ref 39.0–52.0)
Hemoglobin: 14 g/dL (ref 13.0–17.0)
MCH: 30.2 pg (ref 26.0–34.0)
MCHC: 34 g/dL (ref 30.0–36.0)
MCV: 88.8 fL (ref 80.0–100.0)
Platelets: 256 K/uL (ref 150–400)
RBC: 4.64 MIL/uL (ref 4.22–5.81)
RDW: 14.1 % (ref 11.5–15.5)
WBC: 10.5 K/uL (ref 4.0–10.5)
nRBC: 0 % (ref 0.0–0.2)

## 2023-07-20 LAB — COMPREHENSIVE METABOLIC PANEL WITH GFR
ALT: 12 U/L (ref 0–44)
AST: 18 U/L (ref 15–41)
Albumin: 3.3 g/dL — ABNORMAL LOW (ref 3.5–5.0)
Alkaline Phosphatase: 54 U/L (ref 38–126)
Anion gap: 12 (ref 5–15)
BUN: 13 mg/dL (ref 8–23)
CO2: 24 mmol/L (ref 22–32)
Calcium: 8.7 mg/dL — ABNORMAL LOW (ref 8.9–10.3)
Chloride: 104 mmol/L (ref 98–111)
Creatinine, Ser: 0.71 mg/dL (ref 0.61–1.24)
GFR, Estimated: >60 mL/min (ref >60)
Glucose, Bld: 91 mg/dL (ref 70–99)
Potassium: 3.4 mmol/L — ABNORMAL LOW (ref 3.5–5.1)
Sodium: 140 mmol/L (ref 135–145)
Total Bilirubin: 1.4 mg/dL — ABNORMAL HIGH (ref 0.0–1.2)
Total Protein: 6.3 g/dL — ABNORMAL LOW (ref 6.5–8.1)

## 2023-07-20 MED ORDER — DIATRIZOATE MEGLUMINE & SODIUM 66-10 % PO SOLN
90.0000 mL | Freq: Once | ORAL | Status: AC
  Administered 2023-07-20: 90 mL
  Filled 2023-07-20: qty 90

## 2023-07-20 MED ORDER — POTASSIUM CHLORIDE 10 MEQ/100ML IV SOLN
10.0000 meq | INTRAVENOUS | Status: AC
  Administered 2023-07-20 (×4): 10 meq via INTRAVENOUS
  Filled 2023-07-20 (×4): qty 100

## 2023-07-20 NOTE — Progress Notes (Signed)
 Subjective/Chief Complaint: No complaints. Thinks he passed flatus yesterday   Objective: Vital signs in last 24 hours: Temp:  [98.1 F (36.7 C)-99.1 F (37.3 C)] 98.6 F (37 C) (07/05 0404) Pulse Rate:  [82-94] 91 (07/05 0404) Resp:  [16-18] 16 (07/05 0404) BP: (121-138)/(70-86) 125/81 (07/05 0404) SpO2:  [94 %-96 %] 94 % (07/05 0404) Last BM Date : 07/17/23  Intake/Output from previous day: 07/04 0701 - 07/05 0700 In: 2771.3 [I.V.:1771.3; IV Piggyback:1000] Out: 1150 [Urine:300; Emesis/NG output:850] Intake/Output this shift: No intake/output data recorded.  General appearance: alert and cooperative Resp: clear to auscultation bilaterally Cardio: regular rate and rhythm GI: soft, flat, nontender  Lab Results:  Recent Labs    07/19/23 0302 07/20/23 0528  WBC 15.0* 10.5  HGB 16.8 14.0  HCT 48.8 41.2  PLT 341 256   BMET Recent Labs    07/19/23 0302 07/20/23 0528  NA 136 140  K 3.7 3.4*  CL 100 104  CO2 19* 24  GLUCOSE 141* 91  BUN 19 13  CREATININE 0.90 0.71  CALCIUM 9.6 8.7*   PT/INR No results for input(s): LABPROT, INR in the last 72 hours. ABG No results for input(s): PHART, HCO3 in the last 72 hours.  Invalid input(s): PCO2, PO2  Studies/Results: DG Abd Portable 1V-Small Bowel Protocol-Position Verification Result Date: 07/19/2023 CLINICAL DATA:  78 year old male enteric tube placement. EXAM: PORTABLE ABDOMEN - 1 VIEW COMPARISON:  CT Abdomen and Pelvis 0511 hours today. FINDINGS: Portable AP semi upright view at 0701 hours. Enteric tube courses through the mediastinum and terminates in the epigastrium, side hole visible at the level of the gastroesophageal junction when comparing to earlier coronal CT images. Stable bowel gas pattern from that scout view. Stable lung bases. IMPRESSION: Enteric tube placed just inside the stomach, side hole may still be at the GEJ. Recommend advancing 5 cm to ensure side hole placement distal to the  GEJ. Electronically Signed   By: VEAR Hurst M.D.   On: 07/19/2023 07:33   CT ABDOMEN PELVIS W CONTRAST Result Date: 07/19/2023 CLINICAL DATA:  Abdominal pain and vomiting for 2 days. History of small-bowel obstruction. EXAM: CT ABDOMEN AND PELVIS WITH CONTRAST TECHNIQUE: Multidetector CT imaging of the abdomen and pelvis was performed using the standard protocol following bolus administration of intravenous contrast. RADIATION DOSE REDUCTION: This exam was performed according to the departmental dose-optimization program which includes automated exposure control, adjustment of the mA and/or kV according to patient size and/or use of iterative reconstruction technique. CONTRAST:  OMNIPAQUE  IOHEXOL  300 MG/ML  SOLN COMPARISON:  10/26/2022 FINDINGS: Lower chest: No acute findings. Hepatobiliary: No suspicious focal abnormality within the liver parenchyma. There is no evidence for gallstones, gallbladder wall thickening, or pericholecystic fluid. No intrahepatic or extrahepatic biliary dilation. Pancreas: No focal mass lesion. No dilatation of the main duct. No intraparenchymal cyst. No peripancreatic edema. Spleen: No splenomegaly. No suspicious focal mass lesion. Adrenals/Urinary Tract: No adrenal nodule or mass. Right kidney unremarkable. Nonobstructing stones in the lower pole left kidney measure up to 14 mm. 2.9 cm simple cyst noted lower pole left kidney. No followup imaging is recommended. No evidence for hydroureter. The urinary bladder appears normal for the degree of distention. Stomach/Bowel: Stomach is distended with fluid. Duodenum is normally positioned as is the ligament of Treitz. Proximal small bowel loops are fluid-filled and dilated up to 4.2 cm diameter. While no abrupt transition zone is evident, there is small bowel in the lower right abdomen and upper pelvis that  demonstrates fecalization of enteric contents and subtle perienteric edema (see axial 53/2 and coronal 82/7. This abnormal loop then  narrows as it tracks anteriorly towards the supraumbilical region and becomes completely decompressed in the midline upper abdomen. Distal ileal loops of the right lower quadrant a completely decompressed into the terminal ileum. The appendix is not well visualized, but there is no edema or inflammation in the region of the cecal tip to suggest appendicitis. No gross colonic mass. No colonic wall thickening. Diverticuli are seen scattered along the entire length of the colon without CT findings of diverticulitis. Vascular/Lymphatic: There is moderate atherosclerotic calcification of the abdominal aorta without aneurysm. There is no gastrohepatic or hepatoduodenal ligament lymphadenopathy. No retroperitoneal or mesenteric lymphadenopathy. No pelvic sidewall lymphadenopathy. Reproductive: Prostate gland is enlarged. Other: No intraperitoneal free fluid. Musculoskeletal: No worrisome lytic or sclerotic osseous abnormality. Superior endplate compression deformity at L1 is new in the interval since 10/26/2022 as nonacute features by CT imaging. Subtle superior endplate compression deformity at T12 is also new in the interval. IMPRESSION: 1. Proximal small bowel loops are fluid-filled and dilated up to 4.2 cm diameter. While no abrupt transition zone is evident, there is small bowel in the lower right abdomen and upper pelvis with fecalization of enteric contents in perienteric edema. Ileal loops of the right lower quadrant completely decompressed into the ileocecal valve. Imaging features are compatible with small-bowel obstruction. No evidence for small bowel wall thickening or pneumatosis. 2. Colonic diverticulosis without diverticulitis. 3. Nonobstructing left renal stones measure up to 14 mm. 4. Superior endplate compression deformities at T12 and L1 are new in the interval since 10/26/2022. 5.  Aortic Atherosclerosis (ICD10-I70.0). Electronically Signed   By: Camellia Candle M.D.   On: 07/19/2023 06:36     Anti-infectives: Anti-infectives (From admission, onward)    None       Assessment/Plan: s/p * No surgery found * sbo Small bowel protocol initiated. Waiting for 8hr film Continue ng for now Seems to be improving  LOS: 1 day    Deward Null III 07/20/2023

## 2023-07-20 NOTE — Progress Notes (Signed)
 PROGRESS NOTE    Douglas Holmes  FMW:[id number] DOB: 1945/03/29 DOA: 07/19/2023 PCP: Clarice Nottingham, MD   Brief Narrative:  78 year old man history of diverticulosis, hyperlipidemia, bladder cancer and BPH presented to emergency department complaining of abdominal pain, nausea vomiting for 2 days.  Patient reported that he has history of SBO and feels like he has another episode of SBO.  Patient reported he did not have any bowel movement or flatus since 2 days.  Reported abdomen it is slightly distended as compared to his baseline. At presentation to ED patient is hemodynamically stable. CBC showing leukocytosis 15. Normal lipase level. CT abdomen pelvis shows SBO without any transition point.  Also nonobstructing left renal stones measuring up to 14 mm.  As well as T12 and L1 compression fracture.  Patient admitted to hospitalist service, general surgery consulted.  Assessment & Plan:   Principal Problem:   Small bowel obstruction (HCC) Active Problems:   SBO (small bowel obstruction) (HCC)   History of bladder cancer   BPH (benign prostatic hyperplasia)   Hyperlipidemia   QT prolongation   History of SBO as well as diverticular surgery in 2007, now admitted with SBO, POA: Diagnosed clinically and based on the CT scans.  Patient has improved, no nausea, abdominal pain and now he is passing flatus.  SBO protocol was done however per general surgery note, they cannot find any documentation that Gastrografin  was actually given per protocol so they have ordered the study again.  Management per general surgery.   Hyperlipidemia BPH -Hold oral medication in the setting of SBO  Leukocytosis: Likely reactive/nonspecific.  No signs of infection on CT abdomen.  Resolved.  Hypokalemia: Replenished.  DVT prophylaxis: heparin  injection 5,000 Units Start: 07/19/23 1400 SCDs Start: 07/19/23 0603 Place TED hose Start: 07/19/23 0603   Code Status: Full Code  Family Communication:  None present at  bedside.  Plan of care discussed with patient in length and he/she verbalized understanding and agreed with it.  Status is: Inpatient Remains inpatient appropriate because: Improving SBO   Estimated body mass index is 30.77 kg/m as calculated from the following:   Height as of this encounter: 6' 1 (1.854 m).   Weight as of this encounter: 105.8 kg.    Nutritional Assessment: Body mass index is 30.77 kg/m.SABRA Seen by dietician.  I agree with the assessment and plan as outlined below: Nutrition Status:        . Skin Assessment: I have examined the patient's skin and I agree with the wound assessment as performed by the wound care RN as outlined below:    Consultants:  General surgery  Procedures:  As above  Antimicrobials:  Anti-infectives (From admission, onward)    None         Subjective: Seen and examined, appears and feels much comfortable.  Passing flatus.  No other complaints.  Objective: Vitals:   07/19/23 1431 07/19/23 1750 07/19/23 2023 07/20/23 0404  BP: 138/73 121/70 138/86 125/81  Pulse: 82 92 94 91  Resp: 16 18 16 16   Temp: 98.1 F (36.7 C) 99.1 F (37.3 C) 98.5 F (36.9 C) 98.6 F (37 C)  TempSrc:  Oral Oral Oral  SpO2: 96% 95% 95% 94%  Weight:      Height:        Intake/Output Summary (Last 24 hours) at 07/20/2023 1034 Last data filed at 07/20/2023 0600 Gross per 24 hour  Intake 1771.33 ml  Output 1150 ml  Net 621.33 ml   [employer]  07/19/23 0252  Weight: 105.8 kg    Examination:  General exam: Appears calm and comfortable, NG tube in place. Respiratory system: Clear to auscultation. Respiratory effort normal. Cardiovascular system: S1 & S2 heard, RRR. No JVD, murmurs, rubs, gallops or clicks. No pedal edema. Gastrointestinal system: Abdomen is nondistended, soft and nontender. No organomegaly or masses felt. Normal bowel sounds heard. Central nervous system: Alert and oriented. No focal neurological deficits. Extremities:  Symmetric 5 x 5 power. Skin: No rashes, lesions or ulcers.  Psychiatry: Judgement and insight appear normal. Mood & affect appropriate.    Data Reviewed: I have personally reviewed following labs and imaging studies  CBC: Recent Labs  Lab 07/19/23 0302 07/20/23 0528  WBC 15.0* 10.5  HGB 16.8 14.0  HCT 48.8 41.2  MCV 87.6 88.8  PLT 341 256   Basic Metabolic Panel: Recent Labs  Lab 07/19/23 0302 07/20/23 0528  NA 136 140  K 3.7 3.4*  CL 100 104  CO2 19* 24  GLUCOSE 141* 91  BUN 19 13  CREATININE 0.90 0.71  CALCIUM 9.6 8.7*   GFR: Estimated Creatinine Clearance: 97.2 mL/min (by C-G formula based on SCr of 0.71 mg/dL). Liver Function Tests: Recent Labs  Lab 07/19/23 0302 07/20/23 0528  AST 20 18  ALT 17 12  ALKPHOS 71 54  BILITOT 1.6* 1.4*  PROT 7.7 6.3*  ALBUMIN 4.2 3.3*   Recent Labs  Lab 07/19/23 0302  LIPASE 33   No results for input(s): AMMONIA in the last 168 hours. Coagulation Profile: No results for input(s): INR, PROTIME in the last 168 hours. Cardiac Enzymes: No results for input(s): CKTOTAL, CKMB, CKMBINDEX, TROPONINI in the last 168 hours. BNP (last 3 results) No results for input(s): PROBNP in the last 8760 hours. HbA1C: No results for input(s): HGBA1C in the last 72 hours. CBG: No results for input(s): GLUCAP in the last 168 hours. Lipid Profile: No results for input(s): CHOL, HDL, LDLCALC, TRIG, CHOLHDL, LDLDIRECT in the last 72 hours. Thyroid  Function Tests: No results for input(s): TSH, T4TOTAL, FREET4, T3FREE, THYROIDAB in the last 72 hours. Anemia Panel: No results for input(s): VITAMINB12, FOLATE, FERRITIN, TIBC, IRON, RETICCTPCT in the last 72 hours. Sepsis Labs: No results for input(s): PROCALCITON, LATICACIDVEN in the last 168 hours.  No results found for this or any previous visit (from the past 240 hours).   Radiology Studies: DG Abd Portable 1V-Small Bowel  Protocol-Position Verification Result Date: 07/19/2023 CLINICAL DATA:  78 year old male enteric tube placement. EXAM: PORTABLE ABDOMEN - 1 VIEW COMPARISON:  CT Abdomen and Pelvis 0511 hours today. FINDINGS: Portable AP semi upright view at 0701 hours. Enteric tube courses through the mediastinum and terminates in the epigastrium, side hole visible at the level of the gastroesophageal junction when comparing to earlier coronal CT images. Stable bowel gas pattern from that scout view. Stable lung bases. IMPRESSION: Enteric tube placed just inside the stomach, side hole may still be at the GEJ. Recommend advancing 5 cm to ensure side hole placement distal to the GEJ. Electronically Signed   By: VEAR Hurst M.D.   On: 07/19/2023 07:33   CT ABDOMEN PELVIS W CONTRAST Result Date: 07/19/2023 CLINICAL DATA:  Abdominal pain and vomiting for 2 days. History of small-bowel obstruction. EXAM: CT ABDOMEN AND PELVIS WITH CONTRAST TECHNIQUE: Multidetector CT imaging of the abdomen and pelvis was performed using the standard protocol following bolus administration of intravenous contrast. RADIATION DOSE REDUCTION: This exam was performed according to the departmental dose-optimization program which includes  automated exposure control, adjustment of the mA and/or kV according to patient size and/or use of iterative reconstruction technique. CONTRAST:  OMNIPAQUE  IOHEXOL  300 MG/ML  SOLN COMPARISON:  10/26/2022 FINDINGS: Lower chest: No acute findings. Hepatobiliary: No suspicious focal abnormality within the liver parenchyma. There is no evidence for gallstones, gallbladder wall thickening, or pericholecystic fluid. No intrahepatic or extrahepatic biliary dilation. Pancreas: No focal mass lesion. No dilatation of the main duct. No intraparenchymal cyst. No peripancreatic edema. Spleen: No splenomegaly. No suspicious focal mass lesion. Adrenals/Urinary Tract: No adrenal nodule or mass. Right kidney unremarkable. Nonobstructing  stones in the lower pole left kidney measure up to 14 mm. 2.9 cm simple cyst noted lower pole left kidney. No followup imaging is recommended. No evidence for hydroureter. The urinary bladder appears normal for the degree of distention. Stomach/Bowel: Stomach is distended with fluid. Duodenum is normally positioned as is the ligament of Treitz. Proximal small bowel loops are fluid-filled and dilated up to 4.2 cm diameter. While no abrupt transition zone is evident, there is small bowel in the lower right abdomen and upper pelvis that demonstrates fecalization of enteric contents and subtle perienteric edema (see axial 53/2 and coronal 82/7. This abnormal loop then narrows as it tracks anteriorly towards the supraumbilical region and becomes completely decompressed in the midline upper abdomen. Distal ileal loops of the right lower quadrant a completely decompressed into the terminal ileum. The appendix is not well visualized, but there is no edema or inflammation in the region of the cecal tip to suggest appendicitis. No gross colonic mass. No colonic wall thickening. Diverticuli are seen scattered along the entire length of the colon without CT findings of diverticulitis. Vascular/Lymphatic: There is moderate atherosclerotic calcification of the abdominal aorta without aneurysm. There is no gastrohepatic or hepatoduodenal ligament lymphadenopathy. No retroperitoneal or mesenteric lymphadenopathy. No pelvic sidewall lymphadenopathy. Reproductive: Prostate gland is enlarged. Other: No intraperitoneal free fluid. Musculoskeletal: No worrisome lytic or sclerotic osseous abnormality. Superior endplate compression deformity at L1 is new in the interval since 10/26/2022 as nonacute features by CT imaging. Subtle superior endplate compression deformity at T12 is also new in the interval. IMPRESSION: 1. Proximal small bowel loops are fluid-filled and dilated up to 4.2 cm diameter. While no abrupt transition zone is evident,  there is small bowel in the lower right abdomen and upper pelvis with fecalization of enteric contents in perienteric edema. Ileal loops of the right lower quadrant completely decompressed into the ileocecal valve. Imaging features are compatible with small-bowel obstruction. No evidence for small bowel wall thickening or pneumatosis. 2. Colonic diverticulosis without diverticulitis. 3. Nonobstructing left renal stones measure up to 14 mm. 4. Superior endplate compression deformities at T12 and L1 are new in the interval since 10/26/2022. 5.  Aortic Atherosclerosis (ICD10-I70.0). Electronically Signed   By: Camellia Candle M.D.   On: 07/19/2023 06:36    Scheduled Meds:  bisacodyl   10 mg Rectal Daily   diatrizoate  meglumine -sodium  90 mL Per NG tube Once   diatrizoate  meglumine -sodium  90 mL Per Tube Once   heparin   5,000 Units Subcutaneous Q8H   sodium chloride  flush  3 mL Intravenous Q12H   sodium chloride  flush  3 mL Intravenous Q12H   Continuous Infusions:  lactated ringers      potassium chloride  10 mEq (07/20/23 0958)     LOS: 1 day   Fredia Skeeter, MD [facility]  07/20/2023, 10:34 AM   *Please note that this is a verbal dictation therefore any spelling or  grammatical errors are due to the [employer] system interpretation.  Please page via Amion and do not message via secure chat for urgent patient care matters. Secure chat can be used for non urgent patient care matters.  How to contact the [facility] Attending or Consulting provider 7A - 7P or covering provider during after hours 7P -7A, for this patient?  Check the care team in [facility] and look for a) attending/consulting [facility] provider listed and b) the [facility] team listed. Page or secure chat 7A-7P. Log into [email or web address] and use [place]'s universal password to access. If you do not have the password, please contact the hospital operator. Locate the [facility] provider you are looking for under [facility] and page to a number  that you can be directly reached. If you still have difficulty reaching the provider, please page the [facility] (Director on Call) for the Hospitalists listed on amion for assistance.

## 2023-07-20 NOTE — Progress Notes (Signed)
 Neither Dr Curvin nor I can find documentation that Gastrografin  was actually given as ordered per Small Bowel Obstruction  protocol.  Will order again.  Will need to redo the protocol with 8-hour and 24-hour films per protocol.

## 2023-07-21 DIAGNOSIS — K56609 Unspecified intestinal obstruction, unspecified as to partial versus complete obstruction: Secondary | ICD-10-CM | POA: Diagnosis not present

## 2023-07-21 LAB — BASIC METABOLIC PANEL WITH GFR
Anion gap: 13 (ref 5–15)
BUN: 12 mg/dL (ref 8–23)
CO2: 22 mmol/L (ref 22–32)
Calcium: 8.4 mg/dL — ABNORMAL LOW (ref 8.9–10.3)
Chloride: 103 mmol/L (ref 98–111)
Creatinine, Ser: 0.63 mg/dL (ref 0.61–1.24)
GFR, Estimated: >60 mL/min (ref >60)
Glucose, Bld: 83 mg/dL (ref 70–99)
Potassium: 3.3 mmol/L — ABNORMAL LOW (ref 3.5–5.1)
Sodium: 138 mmol/L (ref 135–145)

## 2023-07-21 MED ORDER — POTASSIUM CHLORIDE CRYS ER 20 MEQ PO TBCR
40.0000 meq | EXTENDED_RELEASE_TABLET | ORAL | Status: AC
  Administered 2023-07-21 (×2): 40 meq via ORAL
  Filled 2023-07-21 (×2): qty 2

## 2023-07-21 NOTE — Progress Notes (Signed)
 PROGRESS NOTE    Douglas Holmes  FMW:[id number] DOB: 05/18/1945 DOA: 07/19/2023 PCP: Clarice Nottingham, MD   Brief Narrative:  78 year old man history of diverticulosis, hyperlipidemia, bladder cancer and BPH presented to emergency department complaining of abdominal pain, nausea vomiting for 2 days.  Patient reported that he has history of SBO and feels like he has another episode of SBO.  Patient reported he did not have any bowel movement or flatus since 2 days.  Reported abdomen it is slightly distended as compared to his baseline. At presentation to ED patient is hemodynamically stable. CBC showing leukocytosis 15. Normal lipase level. CT abdomen pelvis shows SBO without any transition point.  Also nonobstructing left renal stones measuring up to 14 mm.  As well as T12 and L1 compression fracture.  Patient admitted to hospitalist service, general surgery consulted.  Assessment & Plan:   Principal Problem:   Small bowel obstruction (HCC) Active Problems:   SBO (small bowel obstruction) (HCC)   History of bladder cancer   BPH (benign prostatic hyperplasia)   Hyperlipidemia   QT prolongation   History of SBO as well as diverticular surgery in 2007, now admitted with SBO, POA: Diagnosed clinically and based on the CT scans.  Patient has improved, no nausea, abdominal pain and passing flatus.  General surgery managing.  He has been started on full liquid diet.  Will advance him to soft diet later today.  Observe overnight and if all goes well, discharge home tomorrow.  Patient in agreement.   Hyperlipidemia BPH -Hold oral medication in the setting of SBO  Leukocytosis: Likely reactive/nonspecific.  No signs of infection on CT abdomen.  Resolved.  Hypokalemia: Replenished yesterday but is still low, will replenish again.  DVT prophylaxis: heparin  injection 5,000 Units Start: 07/19/23 1400 SCDs Start: 07/19/23 0603 Place TED hose Start: 07/19/23 0603   Code Status: Full Code  Family  Communication:  None present at bedside.  Plan of care discussed with patient in length and he/she verbalized understanding and agreed with it.  Status is: Inpatient Remains inpatient appropriate because: Improving SBO, potential discharge tomorrow.   Estimated body mass index is 30.77 kg/m as calculated from the following:   Height as of this encounter: 6' 1 (1.854 m).   Weight as of this encounter: 105.8 kg.    Nutritional Assessment: Body mass index is 30.77 kg/m.SABRA Seen by dietician.  I agree with the assessment and plan as outlined below: Nutrition Status:        . Skin Assessment: I have examined the patient's skin and I agree with the wound assessment as performed by the wound care RN as outlined below:    Consultants:  General surgery  Procedures:  As above  Antimicrobials:  Anti-infectives (From admission, onward)    None         Subjective: Patient seen and examined.  He has no complaints.  He had a bowel movement as well.  Objective: Vitals:   07/20/23 0404 07/20/23 1236 07/20/23 2047 07/21/23 0443  BP: 125/81 (!) 144/91 124/89 128/85  Pulse: 91 99 84 83  Resp: 16 14 16 20   Temp: 98.6 F (37 C) 99.2 F (37.3 C) 99.3 F (37.4 C) 98.8 F (37.1 C)  TempSrc: Oral Oral Oral Oral  SpO2: 94% 94% 95% 94%  Weight:      Height:        Intake/Output Summary (Last 24 hours) at 07/21/2023 9191 Last data filed at 07/21/2023 0451 Gross per 24 hour  Intake  0 ml  Output 450 ml  Net -450 ml   [employer]   07/19/23 0252  Weight: 105.8 kg    Examination:  General exam: Appears calm and comfortable  Respiratory system: Clear to auscultation. Respiratory effort normal. Cardiovascular system: S1 & S2 heard, RRR. No JVD, murmurs, rubs, gallops or clicks. No pedal edema. Gastrointestinal system: Abdomen is nondistended, soft and nontender. No organomegaly or masses felt. Normal bowel sounds heard. Central nervous system: Alert and oriented. No focal  neurological deficits. Extremities: Symmetric 5 x 5 power. Skin: No rashes, lesions or ulcers.  Psychiatry: Judgement and insight appear normal. Mood & affect appropriate.    Data Reviewed: I have personally reviewed following labs and imaging studies  CBC: Recent Labs  Lab 07/19/23 0302 07/20/23 0528  WBC 15.0* 10.5  HGB 16.8 14.0  HCT 48.8 41.2  MCV 87.6 88.8  PLT 341 256   Basic Metabolic Panel: Recent Labs  Lab 07/19/23 0302 07/20/23 0528 07/21/23 0456  NA 136 140 138  K 3.7 3.4* 3.3*  CL 100 104 103  CO2 19* 24 22  GLUCOSE 141* 91 83  BUN 19 13 12   CREATININE 0.90 0.71 0.63  CALCIUM 9.6 8.7* 8.4*   GFR: Estimated Creatinine Clearance: 97.2 mL/min (by C-G formula based on SCr of 0.63 mg/dL). Liver Function Tests: Recent Labs  Lab 07/19/23 0302 07/20/23 0528  AST 20 18  ALT 17 12  ALKPHOS 71 54  BILITOT 1.6* 1.4*  PROT 7.7 6.3*  ALBUMIN 4.2 3.3*   Recent Labs  Lab 07/19/23 0302  LIPASE 33   No results for input(s): AMMONIA in the last 168 hours. Coagulation Profile: No results for input(s): INR, PROTIME in the last 168 hours. Cardiac Enzymes: No results for input(s): CKTOTAL, CKMB, CKMBINDEX, TROPONINI in the last 168 hours. BNP (last 3 results) No results for input(s): PROBNP in the last 8760 hours. HbA1C: No results for input(s): HGBA1C in the last 72 hours. CBG: No results for input(s): GLUCAP in the last 168 hours. Lipid Profile: No results for input(s): CHOL, HDL, LDLCALC, TRIG, CHOLHDL, LDLDIRECT in the last 72 hours. Thyroid  Function Tests: No results for input(s): TSH, T4TOTAL, FREET4, T3FREE, THYROIDAB in the last 72 hours. Anemia Panel: No results for input(s): VITAMINB12, FOLATE, FERRITIN, TIBC, IRON, RETICCTPCT in the last 72 hours. Sepsis Labs: No results for input(s): PROCALCITON, LATICACIDVEN in the last 168 hours.  No results found for this or any previous visit (from  the past 240 hours).   Radiology Studies: DG Abd Portable 1V-Small Bowel Obstruction Protocol-initial, 8 hr delay Result Date: 07/20/2023 CLINICAL DATA:  Small bowel obstruction protocol, 8 hour delayed image EXAM: PORTABLE ABDOMEN - 1 VIEW COMPARISON:  07/19/2023 FINDINGS: Contrast material noted throughout the colon. Currently no dilated small bowel visualized. No organomegaly or free air. NG tube remains in the stomach. IMPRESSION: Oral contrast material throughout the colon. No dilated small bowel loops visualized currently. Electronically Signed   By: Franky Crease M.D.   On: 07/20/2023 19:06    Scheduled Meds:  bisacodyl   10 mg Rectal Daily   diatrizoate  meglumine -sodium  90 mL Per NG tube Once   heparin   5,000 Units Subcutaneous Q8H   potassium chloride   40 mEq Oral Q4H   sodium chloride  flush  3 mL Intravenous Q12H   sodium chloride  flush  3 mL Intravenous Q12H   Continuous Infusions:     LOS: 2 days   Fredia Skeeter, MD [facility]  07/21/2023, 8:08 AM   *  Please note that this is a verbal dictation therefore any spelling or grammatical errors are due to the [employer] system interpretation.  Please page via Amion and do not message via secure chat for urgent patient care matters. Secure chat can be used for non urgent patient care matters.  How to contact the [facility] Attending or Consulting provider 7A - 7P or covering provider during after hours 7P -7A, for this patient?  Check the care team in [facility] and look for a) attending/consulting [facility] provider listed and b) the [facility] team listed. Page or secure chat 7A-7P. Log into [email or web address] and use [place]'s universal password to access. If you do not have the password, please contact the hospital operator. Locate the [facility] provider you are looking for under [facility] and page to a number that you can be directly reached. If you still have difficulty reaching the provider, please page the [facility] (Director on Call) for  the Hospitalists listed on amion for assistance.

## 2023-07-21 NOTE — Progress Notes (Signed)
   Subjective/Chief Complaint: No complaints. Pt reports 2 bm's   Objective: Vital signs in last 24 hours: Temp:  [98.8 F (37.1 C)-99.3 F (37.4 C)] 98.8 F (37.1 C) (07/06 0443) Pulse Rate:  [83-99] 83 (07/06 0443) Resp:  [14-20] 20 (07/06 0443) BP: (124-144)/(85-91) 128/85 (07/06 0443) SpO2:  [94 %-95 %] 94 % (07/06 0443) Last BM Date : 07/20/23  Intake/Output from previous day: 07/05 0701 - 07/06 0700 In: 0  Out: 450 [Urine:400; Emesis/NG output:50] Intake/Output this shift: No intake/output data recorded.  General appearance: alert and cooperative Resp: clear to auscultation bilaterally Cardio: regular rate and rhythm GI: soft, flat, nontender  Lab Results:  Recent Labs    07/19/23 0302 07/20/23 0528  WBC 15.0* 10.5  HGB 16.8 14.0  HCT 48.8 41.2  PLT 341 256   BMET Recent Labs    07/20/23 0528 07/21/23 0456  NA 140 138  K 3.4* 3.3*  CL 104 103  CO2 24 22  GLUCOSE 91 83  BUN 13 12  CREATININE 0.71 0.63  CALCIUM 8.7* 8.4*   PT/INR No results for input(s): LABPROT, INR in the last 72 hours. ABG No results for input(s): PHART, HCO3 in the last 72 hours.  Invalid input(s): PCO2, PO2  Studies/Results: DG Abd Portable 1V-Small Bowel Obstruction Protocol-initial, 8 hr delay Result Date: 07/20/2023 CLINICAL DATA:  Small bowel obstruction protocol, 8 hour delayed image EXAM: PORTABLE ABDOMEN - 1 VIEW COMPARISON:  07/19/2023 FINDINGS: Contrast material noted throughout the colon. Currently no dilated small bowel visualized. No organomegaly or free air. NG tube remains in the stomach. IMPRESSION: Oral contrast material throughout the colon. No dilated small bowel loops visualized currently. Electronically Signed   By: Franky Crease M.D.   On: 07/20/2023 19:06    Anti-infectives: Anti-infectives (From admission, onward)    None       Assessment/Plan: s/p * No surgery found * Advance diet D/c ng Sbo seems to have resolved Will sign  off  LOS: 2 days    Deward Null III 07/21/2023

## 2023-07-21 NOTE — Plan of Care (Signed)
   Problem: Elimination: Goal: Will not experience complications related to bowel motility Outcome: Progressing

## 2023-07-22 DIAGNOSIS — K56609 Unspecified intestinal obstruction, unspecified as to partial versus complete obstruction: Secondary | ICD-10-CM | POA: Diagnosis not present

## 2023-07-22 NOTE — Discharge Summary (Signed)
 Physician Discharge Summary  Douglas Holmes FMW:[id number] DOB: 02-Sep-1945 DOA: 07/19/2023  PCP: Clarice Nottingham, MD  Admit date: 07/19/2023 Discharge date: 07/22/2023 30 Day Unplanned Readmission Risk Score    Flowsheet Row ED to Hosp-Admission (Discharged) from 07/19/2023 in [facility]  30 Day Unplanned Readmission Risk Score (%) 13.26 Filed at 07/22/2023 0801    This score is the patient's risk of an unplanned readmission within 30 days of being discharged (0 -100%). The score is based on dignosis, age, lab data, medications, orders, and past utilization.   Low:  0-14.9   Medium: 15-21.9   High: 22-29.9   Extreme: 30 and above          Admitted From: Home Disposition: Home  Recommendations for Outpatient Follow-up:  Follow up with PCP in 1-2 weeks Please obtain BMP/CBC in one week Please follow up with your PCP on the following pending results: Unresulted Labs (From admission, onward)     Start     Ordered   07/19/23 0253  Urinalysis, Routine w reflex microscopic -Urine, Clean Catch  Once,   URGENT       Question:  Specimen Source  Answer:  Urine, Clean Catch   07/19/23 0253              Home Health: None Equipment/Devices: None  Discharge Condition: Stable CODE STATUS: Full code Diet recommendation: Cardiac  Subjective: Seen and examined, feeling well, multiple bowel movements yesterday, no complaints.  Tolerating regular diet.  Ready to go home.  Brief/Interim Summary: 78 year old man history of diverticulosis, hyperlipidemia, bladder cancer and BPH presented to emergency department complaining of abdominal pain, nausea vomiting for 2 days.  Patient reported that he has history of SBO and felt like he has another episode of SBO.  Patient reported he did not have any bowel movement or flatus since 2 days.  Reported abdomen it is slightly distended as compared to his baseline. At presentation to ED patient was hemodynamically stable,  CBC showing leukocytosis 15. Normal lipase level. CT abdomen pelvis shows SBO without any transition point.  Also nonobstructing left renal stones measuring up to 14 mm.  As well as T12 and L1 compression fracture.  Patient admitted to hospitalist service, general surgery consulted.  Details below.  History of SBO as well as diverticular surgery in 2007, now admitted with SBO, POA: Diagnosed clinically and based on the CT scans.  Patient has improved, no nausea, abdominal pain and passing flatus.  General surgery was on board, patient was managed conservatively, started on clear liquid diets and advanced and he is now tolerating regular diet and has had several bowel movements without any complaints now.  He is being discharged to home in stable condition.   Hyperlipidemia BPH - Resume home meds.   Leukocytosis: Likely reactive/nonspecific.  No signs of infection on CT abdomen.  Resolved.   Hypokalemia: Resolved.   Discharge Diagnoses:  Principal Problem:   Small bowel obstruction (HCC) Active Problems:   SBO (small bowel obstruction) (HCC)   History of bladder cancer   BPH (benign prostatic hyperplasia)   Hyperlipidemia   QT prolongation    Discharge Instructions   Allergies as of 07/22/2023   No Known Allergies      Medication List     TAKE these medications    aspirin EC 81 MG tablet Take 81 mg by mouth daily.   ezetimibe 10 MG tablet Commonly known as: ZETIA Take 10 mg by mouth daily.  FLUORIDE TOOTHPASTE DT Place onto teeth daily.   HAIR SKIN AND NAILS FORMULA PO Take 1 tablet by mouth daily.   multivitamin with minerals Tabs tablet Take 1 tablet by mouth daily.   Osteo Bi-Flex One Per Day Tabs Take 1 tablet by mouth daily.   rosuvastatin 20 MG tablet Commonly known as: CRESTOR Take 20 mg by mouth daily.   triamcinolone cream 0.1 % Commonly known as: KENALOG Apply 1 Application topically 2 (two) times daily.        Follow-up Information      Clarice Nottingham, MD Follow up in 1 week(s).   Specialty: Internal Medicine Contact information: [address] [place] [address] [place] [address] [phone]                No Known Allergies  Consultations: General Surgery   Procedures/Studies: DG Abd Portable 1V-Small Bowel Obstruction Protocol-initial, 8 hr delay Result Date: 07/20/2023 CLINICAL DATA:  Small bowel obstruction protocol, 8 hour delayed image EXAM: PORTABLE ABDOMEN - 1 VIEW COMPARISON:  07/19/2023 FINDINGS: Contrast material noted throughout the colon. Currently no dilated small bowel visualized. No organomegaly or free air. NG tube remains in the stomach. IMPRESSION: Oral contrast material throughout the colon. No dilated small bowel loops visualized currently. Electronically Signed   By: Franky Crease M.D.   On: 07/20/2023 19:06   DG Abd Portable 1V-Small Bowel Protocol-Position Verification Result Date: 07/19/2023 CLINICAL DATA:  78 year old male enteric tube placement. EXAM: PORTABLE ABDOMEN - 1 VIEW COMPARISON:  CT Abdomen and Pelvis 0511 hours today. FINDINGS: Portable AP semi upright view at 0701 hours. Enteric tube courses through the mediastinum and terminates in the epigastrium, side hole visible at the level of the gastroesophageal junction when comparing to earlier coronal CT images. Stable bowel gas pattern from that scout view. Stable lung bases. IMPRESSION: Enteric tube placed just inside the stomach, side hole may still be at the GEJ. Recommend advancing 5 cm to ensure side hole placement distal to the GEJ. Electronically Signed   By: VEAR Hurst M.D.   On: 07/19/2023 07:33   CT ABDOMEN PELVIS W CONTRAST Result Date: 07/19/2023 CLINICAL DATA:  Abdominal pain and vomiting for 2 days. History of small-bowel obstruction. EXAM: CT ABDOMEN AND PELVIS WITH CONTRAST TECHNIQUE: Multidetector CT imaging of the abdomen and pelvis was performed using the standard protocol following bolus administration of intravenous  contrast. RADIATION DOSE REDUCTION: This exam was performed according to the departmental dose-optimization program which includes automated exposure control, adjustment of the mA and/or kV according to patient size and/or use of iterative reconstruction technique. CONTRAST:  OMNIPAQUE  IOHEXOL  300 MG/ML  SOLN COMPARISON:  10/26/2022 FINDINGS: Lower chest: No acute findings. Hepatobiliary: No suspicious focal abnormality within the liver parenchyma. There is no evidence for gallstones, gallbladder wall thickening, or pericholecystic fluid. No intrahepatic or extrahepatic biliary dilation. Pancreas: No focal mass lesion. No dilatation of the main duct. No intraparenchymal cyst. No peripancreatic edema. Spleen: No splenomegaly. No suspicious focal mass lesion. Adrenals/Urinary Tract: No adrenal nodule or mass. Right kidney unremarkable. Nonobstructing stones in the lower pole left kidney measure up to 14 mm. 2.9 cm simple cyst noted lower pole left kidney. No followup imaging is recommended. No evidence for hydroureter. The urinary bladder appears normal for the degree of distention. Stomach/Bowel: Stomach is distended with fluid. Duodenum is normally positioned as is the ligament of Treitz. Proximal small bowel loops are fluid-filled and dilated up to 4.2 cm diameter. While no abrupt transition zone is evident,  there is small bowel in the lower right abdomen and upper pelvis that demonstrates fecalization of enteric contents and subtle perienteric edema (see axial 53/2 and coronal 82/7. This abnormal loop then narrows as it tracks anteriorly towards the supraumbilical region and becomes completely decompressed in the midline upper abdomen. Distal ileal loops of the right lower quadrant a completely decompressed into the terminal ileum. The appendix is not well visualized, but there is no edema or inflammation in the region of the cecal tip to suggest appendicitis. No gross colonic mass. No colonic wall  thickening. Diverticuli are seen scattered along the entire length of the colon without CT findings of diverticulitis. Vascular/Lymphatic: There is moderate atherosclerotic calcification of the abdominal aorta without aneurysm. There is no gastrohepatic or hepatoduodenal ligament lymphadenopathy. No retroperitoneal or mesenteric lymphadenopathy. No pelvic sidewall lymphadenopathy. Reproductive: Prostate gland is enlarged. Other: No intraperitoneal free fluid. Musculoskeletal: No worrisome lytic or sclerotic osseous abnormality. Superior endplate compression deformity at L1 is new in the interval since 10/26/2022 as nonacute features by CT imaging. Subtle superior endplate compression deformity at T12 is also new in the interval. IMPRESSION: 1. Proximal small bowel loops are fluid-filled and dilated up to 4.2 cm diameter. While no abrupt transition zone is evident, there is small bowel in the lower right abdomen and upper pelvis with fecalization of enteric contents in perienteric edema. Ileal loops of the right lower quadrant completely decompressed into the ileocecal valve. Imaging features are compatible with small-bowel obstruction. No evidence for small bowel wall thickening or pneumatosis. 2. Colonic diverticulosis without diverticulitis. 3. Nonobstructing left renal stones measure up to 14 mm. 4. Superior endplate compression deformities at T12 and L1 are new in the interval since 10/26/2022. 5.  Aortic Atherosclerosis (ICD10-I70.0). Electronically Signed   By: Camellia Candle M.D.   On: 07/19/2023 06:36   US  FNA BX THYROID  1ST LESION AFIRMA Result Date: 07/05/2023 INDICATION: Nodule # 6: Left Inferior gland measures 2.6 x 2.6 x 2.1 cm. Left inferior thyroid  nodule. 2.6 cm EXAM: ULTRASOUND GUIDED FINE NEEDLE ASPIRATION OF INDETERMINATE THYROID  NODULE COMPARISON:  US  Thyroid  06/06/23. MEDICATIONS: 8 cc 1% lidocaine  COMPLICATIONS: None immediate. TECHNIQUE: Informed written consent was obtained from the patient  after a discussion of the risks, benefits and alternatives to treatment. Questions regarding the procedure were encouraged and answered. A timeout was performed prior to the initiation of the procedure. Pre-procedural ultrasound scanning demonstrated unchanged size and appearance of the indeterminate nodule within the ; left thyroid  The procedure was planned. The neck was prepped in the usual sterile fashion, and a sterile drape was applied covering the operative field. A timeout was performed prior to the initiation of the procedure. Local anesthesia was provided with 1% lidocaine . Under direct ultrasound guidance, 5 FNA biopsies were performed of the left inferior thyroid  nodule with a 25 gauge needle. 2 of these samples were obtained for [facility] Multiple ultrasound images were saved for procedural documentation purposes. The samples were prepared and submitted to pathology. Limited post procedural scanning was negative for hematoma or additional complication. Dressings were placed. The patient tolerated the above procedures procedure well without immediate postprocedural complication. FINDINGS: Nodule reference number based on prior diagnostic ultrasound: 6 Maximum size: 2.6 cm Location: Left; Inferior ACR TI-RADS risk category: TR3 (3 points) Reason for biopsy: meets ACR TI-RADS criteria Ultrasound imaging confirms appropriate placement of the needles within the thyroid  nodule. IMPRESSION: Successful ultrasound guided FNA biopsy of a 2.6 cm LEFT inferior TR-3 thyroid  nodule Performed by Sharlet DELENA Candle  PAC Electronically Signed   By: Thom Hall M.D.   On: 07/05/2023 16:44     Discharge Exam: Vitals:   07/21/23 2103 07/22/23 0531  BP: 127/78 123/76  Pulse: 97 86  Resp: 18 15  Temp: 98.1 F (36.7 C) 98.8 F (37.1 C)  SpO2: 96% 93%   Vitals:   07/21/23 0443 07/21/23 1259 07/21/23 2103 07/22/23 0531  BP: 128/85 109/67 127/78 123/76  Pulse: 83 94 97 86  Resp: 20 17 18 15   Temp: 98.8 F (37.1 C)  98.8 F (37.1 C) 98.1 F (36.7 C) 98.8 F (37.1 C)  TempSrc: Oral Oral  Oral  SpO2: 94% 95% 96% 93%  Weight:      Height:        General: Pt is alert, awake, not in acute distress Cardiovascular: RRR, S1/S2 +, no rubs, no gallops Respiratory: CTA bilaterally, no wheezing, no rhonchi Abdominal: Soft, NT, ND, bowel sounds + Extremities: no edema, no cyanosis    The results of significant diagnostics from this hospitalization (including imaging, microbiology, ancillary and laboratory) are listed below for reference.     Microbiology: No results found for this or any previous visit (from the past 240 hours).   Labs: BNP (last 3 results) No results for input(s): BNP in the last 8760 hours. Basic Metabolic Panel: Recent Labs  Lab 07/19/23 0302 07/20/23 0528 07/21/23 0456  NA 136 140 138  K 3.7 3.4* 3.3*  CL 100 104 103  CO2 19* 24 22  GLUCOSE 141* 91 83  BUN 19 13 12   CREATININE 0.90 0.71 0.63  CALCIUM 9.6 8.7* 8.4*   Liver Function Tests: Recent Labs  Lab 07/19/23 0302 07/20/23 0528  AST 20 18  ALT 17 12  ALKPHOS 71 54  BILITOT 1.6* 1.4*  PROT 7.7 6.3*  ALBUMIN 4.2 3.3*   Recent Labs  Lab 07/19/23 0302  LIPASE 33   No results for input(s): AMMONIA in the last 168 hours. CBC: Recent Labs  Lab 07/19/23 0302 07/20/23 0528  WBC 15.0* 10.5  HGB 16.8 14.0  HCT 48.8 41.2  MCV 87.6 88.8  PLT 341 256   Cardiac Enzymes: No results for input(s): CKTOTAL, CKMB, CKMBINDEX, TROPONINI in the last 168 hours. BNP: Invalid input(s): POCBNP CBG: No results for input(s): GLUCAP in the last 168 hours. D-Dimer No results for input(s): DDIMER in the last 72 hours. Hgb A1c No results for input(s): HGBA1C in the last 72 hours. Lipid Profile No results for input(s): CHOL, HDL, LDLCALC, TRIG, CHOLHDL, LDLDIRECT in the last 72 hours. Thyroid  function studies No results for input(s): TSH, T4TOTAL, T3FREE, THYROIDAB in the last  72 hours.  Invalid input(s): FREET3 Anemia work up No results for input(s): VITAMINB12, FOLATE, FERRITIN, TIBC, IRON, RETICCTPCT in the last 72 hours. Urinalysis    Component Value Date/Time   COLORURINE YELLOW 07/05/2015 2253   APPEARANCEUR CLEAR 07/05/2015 2253   LABSPEC 1.042 (H) 07/05/2015 2253   PHURINE 5.0 07/05/2015 2253   GLUCOSEU NEGATIVE 07/05/2015 2253   HGBUR NEGATIVE 07/05/2015 2253   BILIRUBINUR NEGATIVE 07/05/2015 2253   KETONESUR NEGATIVE 07/05/2015 2253   PROTEINUR NEGATIVE 07/05/2015 2253   NITRITE NEGATIVE 07/05/2015 2253   LEUKOCYTESUR NEGATIVE 07/05/2015 2253   Sepsis Labs Recent Labs  Lab 07/19/23 0302 07/20/23 0528  WBC 15.0* 10.5   Microbiology No results found for this or any previous visit (from the past 240 hours).  FURTHER DISCHARGE INSTRUCTIONS:   Get Medicines reviewed and adjusted: Please take all your  medications with you for your next visit with your Primary MD   Laboratory/radiological data: Please request your Primary MD to go over all hospital tests and procedure/radiological results at the follow up, please ask your Primary MD to get all Hospital records sent to his/her office.   In some cases, they will be blood work, cultures and biopsy results pending at the time of your discharge. Please request that your primary care M.D. goes through all the records of your hospital data and follows up on these results.   Also Note the following: If you experience worsening of your admission symptoms, develop shortness of breath, life threatening emergency, suicidal or homicidal thoughts you must seek medical attention immediately by calling 911 or calling your MD immediately  if symptoms less severe.   You must read complete instructions/literature along with all the possible adverse reactions/side effects for all the Medicines you take and that have been prescribed to you. Take any new Medicines after you have completely understood  and accpet all the possible adverse reactions/side effects.    patient was instructed, not to drive, operate heavy machinery, perform activities at heights, swimming or participation in water  activities or provide baby-sitting services while on Pain, Sleep and Anxiety Medications; until their outpatient Physician has advised to do so again. Also recommended to not to take more than prescribed Pain, Sleep and Anxiety Medications.  It is not advisable to combine anxiety, sleep and pain medications without talking with your primary care provider.     Wear Seat belts while driving.   Please note: You were cared for by a hospitalist during your hospital stay. Once you are discharged, your primary care physician will handle any further medical issues. Please note that NO REFILLS for any discharge medications will be authorized once you are discharged, as it is imperative that you return to your primary care physician (or establish a relationship with a primary care physician if you do not have one) for your post hospital discharge needs so that they can reassess your need for medications and monitor your lab values  Time coordinating discharge: Over 30 minutes  SIGNED:   Fredia Skeeter, MD  [facility] 07/22/2023, 10:40 AM *Please note that this is a verbal dictation therefore any spelling or grammatical errors are due to the Dragon Medical One system interpretation. If 7PM-7AM, please contact night-coverage [email or web address]

## 2023-07-22 NOTE — Care Management Important Message (Signed)
 Important Message  Patient Details IM Letter mailed to Patients home. Name: Douglas Holmes MRN: [id number] Date of Birth: 03-Nov-1945   Important Message Given:       Melba Ates 07/22/2023, 12:07 PM

## 2023-07-23 ENCOUNTER — Telehealth: Payer: Self-pay

## 2023-07-23 NOTE — Patient Instructions (Signed)
 Visit Information  Thank you for taking time to visit with me today. Please don't hesitate to contact me if I can be of assistance to you.  What can you do to prevent a bowel obstruction? Bowel blockage (obstruction) may be prevented by doing several things. Try eating smaller meals more often throughout the day. Chew your food very well. Try to chew each bite until it is liquid. Avoid high-fiber foods and raw fruits and vegetables. These may cause another blockage. Drinking plenty of water  may help. If you have kidney, heart, or liver disease and have to limit fluids, talk with your doctor before you increase the amount of fluids you drink. Your doctor may ask that you drink high-calorie liquid formulas if your symptoms require them. You should check with your doctor before eating whole-grain products or using a fiber supplement such as Citrucel or Metamucil. Try to get at least 30 minutes of physical activity on most days of the week. Walking is a good choice. When should you call for help?  Call your doctor now or seek immediate medical care if: You have a fever. You are vomiting. You have new or worse belly pain. You cannot pass stools or gas.   Patient verbalizes understanding of instructions and care plan provided today and agrees to view in MyChart. Active MyChart status and patient understanding of how to access instructions and care plan via MyChart confirmed with patient.     The patient has been provided with contact information for the care management team and has been advised to call with any health related questions or concerns.   Please call the care guide team at [phone] if you need to cancel or reschedule your appointment.   Please call the Suicide and Crisis Lifeline: 988 if you are experiencing a Mental Health or Behavioral Health Crisis or need someone to talk to.  Malavika Lira J. Duana Benedict RN, MSN [facility], [facility] RN Care  Manager Direct Dial: [phone]  Fax: [phone] Website: [email or web address]

## 2023-07-24 DIAGNOSIS — E041 Nontoxic single thyroid nodule: Secondary | ICD-10-CM | POA: Diagnosis not present

## 2023-07-24 DIAGNOSIS — E778 Other disorders of glycoprotein metabolism: Secondary | ICD-10-CM | POA: Diagnosis not present

## 2023-07-24 DIAGNOSIS — E876 Hypokalemia: Secondary | ICD-10-CM | POA: Diagnosis not present

## 2023-07-24 DIAGNOSIS — R9431 Abnormal electrocardiogram [ECG] [EKG]: Secondary | ICD-10-CM | POA: Diagnosis not present

## 2023-07-24 DIAGNOSIS — Z8719 Personal history of other diseases of the digestive system: Secondary | ICD-10-CM | POA: Diagnosis not present

## 2023-07-24 DIAGNOSIS — I700 Atherosclerosis of aorta: Secondary | ICD-10-CM | POA: Diagnosis not present

## 2023-07-24 DIAGNOSIS — S22000A Wedge compression fracture of unspecified thoracic vertebra, initial encounter for closed fracture: Secondary | ICD-10-CM | POA: Diagnosis not present

## 2023-07-29 DIAGNOSIS — M9905 Segmental and somatic dysfunction of pelvic region: Secondary | ICD-10-CM | POA: Diagnosis not present

## 2023-07-29 DIAGNOSIS — M9903 Segmental and somatic dysfunction of lumbar region: Secondary | ICD-10-CM | POA: Diagnosis not present

## 2023-07-29 DIAGNOSIS — M5451 Vertebrogenic low back pain: Secondary | ICD-10-CM | POA: Diagnosis not present

## 2023-07-29 DIAGNOSIS — M9902 Segmental and somatic dysfunction of thoracic region: Secondary | ICD-10-CM | POA: Diagnosis not present

## 2023-08-06 DIAGNOSIS — E7800 Pure hypercholesterolemia, unspecified: Secondary | ICD-10-CM | POA: Diagnosis not present

## 2023-08-06 DIAGNOSIS — M810 Age-related osteoporosis without current pathological fracture: Secondary | ICD-10-CM | POA: Diagnosis not present

## 2023-08-06 DIAGNOSIS — M8000XA Age-related osteoporosis with current pathological fracture, unspecified site, initial encounter for fracture: Secondary | ICD-10-CM | POA: Diagnosis not present

## 2023-08-07 DIAGNOSIS — M9905 Segmental and somatic dysfunction of pelvic region: Secondary | ICD-10-CM | POA: Diagnosis not present

## 2023-08-07 DIAGNOSIS — M9902 Segmental and somatic dysfunction of thoracic region: Secondary | ICD-10-CM | POA: Diagnosis not present

## 2023-08-07 DIAGNOSIS — M9903 Segmental and somatic dysfunction of lumbar region: Secondary | ICD-10-CM | POA: Diagnosis not present

## 2023-08-07 DIAGNOSIS — M5451 Vertebrogenic low back pain: Secondary | ICD-10-CM | POA: Diagnosis not present

## 2023-08-12 DIAGNOSIS — E7800 Pure hypercholesterolemia, unspecified: Secondary | ICD-10-CM | POA: Diagnosis not present

## 2023-08-14 DIAGNOSIS — M5451 Vertebrogenic low back pain: Secondary | ICD-10-CM | POA: Diagnosis not present

## 2023-08-14 DIAGNOSIS — M9902 Segmental and somatic dysfunction of thoracic region: Secondary | ICD-10-CM | POA: Diagnosis not present

## 2023-08-14 DIAGNOSIS — M9903 Segmental and somatic dysfunction of lumbar region: Secondary | ICD-10-CM | POA: Diagnosis not present

## 2023-08-14 DIAGNOSIS — M9905 Segmental and somatic dysfunction of pelvic region: Secondary | ICD-10-CM | POA: Diagnosis not present

## 2023-08-15 DIAGNOSIS — Z8551 Personal history of malignant neoplasm of bladder: Secondary | ICD-10-CM | POA: Diagnosis not present

## 2023-08-15 DIAGNOSIS — N189 Chronic kidney disease, unspecified: Secondary | ICD-10-CM | POA: Diagnosis not present

## 2023-08-15 DIAGNOSIS — Z87891 Personal history of nicotine dependence: Secondary | ICD-10-CM | POA: Diagnosis not present

## 2023-08-15 DIAGNOSIS — Z8249 Family history of ischemic heart disease and other diseases of the circulatory system: Secondary | ICD-10-CM | POA: Diagnosis not present

## 2023-08-15 DIAGNOSIS — Z7982 Long term (current) use of aspirin: Secondary | ICD-10-CM | POA: Diagnosis not present

## 2023-08-15 DIAGNOSIS — M545 Low back pain, unspecified: Secondary | ICD-10-CM | POA: Diagnosis not present

## 2023-08-15 DIAGNOSIS — R32 Unspecified urinary incontinence: Secondary | ICD-10-CM | POA: Diagnosis not present

## 2023-08-15 DIAGNOSIS — N400 Enlarged prostate without lower urinary tract symptoms: Secondary | ICD-10-CM | POA: Diagnosis not present

## 2023-08-15 DIAGNOSIS — E785 Hyperlipidemia, unspecified: Secondary | ICD-10-CM | POA: Diagnosis not present

## 2023-08-15 DIAGNOSIS — R030 Elevated blood-pressure reading, without diagnosis of hypertension: Secondary | ICD-10-CM | POA: Diagnosis not present

## 2023-08-15 DIAGNOSIS — M1990 Unspecified osteoarthritis, unspecified site: Secondary | ICD-10-CM | POA: Diagnosis not present

## 2023-08-15 DIAGNOSIS — Z818 Family history of other mental and behavioral disorders: Secondary | ICD-10-CM | POA: Diagnosis not present

## 2023-08-21 DIAGNOSIS — M9903 Segmental and somatic dysfunction of lumbar region: Secondary | ICD-10-CM | POA: Diagnosis not present

## 2023-08-21 DIAGNOSIS — M5451 Vertebrogenic low back pain: Secondary | ICD-10-CM | POA: Diagnosis not present

## 2023-08-21 DIAGNOSIS — M9902 Segmental and somatic dysfunction of thoracic region: Secondary | ICD-10-CM | POA: Diagnosis not present

## 2023-08-21 DIAGNOSIS — M9905 Segmental and somatic dysfunction of pelvic region: Secondary | ICD-10-CM | POA: Diagnosis not present

## 2023-08-28 DIAGNOSIS — D1809 Hemangioma of other sites: Secondary | ICD-10-CM | POA: Diagnosis not present

## 2023-08-28 DIAGNOSIS — H318 Other specified disorders of choroid: Secondary | ICD-10-CM | POA: Diagnosis not present

## 2023-08-29 DIAGNOSIS — M5451 Vertebrogenic low back pain: Secondary | ICD-10-CM | POA: Diagnosis not present

## 2023-08-29 DIAGNOSIS — M9905 Segmental and somatic dysfunction of pelvic region: Secondary | ICD-10-CM | POA: Diagnosis not present

## 2023-08-29 DIAGNOSIS — M9902 Segmental and somatic dysfunction of thoracic region: Secondary | ICD-10-CM | POA: Diagnosis not present

## 2023-08-29 DIAGNOSIS — M9903 Segmental and somatic dysfunction of lumbar region: Secondary | ICD-10-CM | POA: Diagnosis not present

## 2023-09-02 ENCOUNTER — Other Ambulatory Visit: Payer: Self-pay | Admitting: Medical Genetics

## 2023-09-04 DIAGNOSIS — M810 Age-related osteoporosis without current pathological fracture: Secondary | ICD-10-CM | POA: Diagnosis not present

## 2023-09-12 DIAGNOSIS — M9902 Segmental and somatic dysfunction of thoracic region: Secondary | ICD-10-CM | POA: Diagnosis not present

## 2023-09-12 DIAGNOSIS — M5451 Vertebrogenic low back pain: Secondary | ICD-10-CM | POA: Diagnosis not present

## 2023-09-12 DIAGNOSIS — M9903 Segmental and somatic dysfunction of lumbar region: Secondary | ICD-10-CM | POA: Diagnosis not present

## 2023-09-12 DIAGNOSIS — M9905 Segmental and somatic dysfunction of pelvic region: Secondary | ICD-10-CM | POA: Diagnosis not present

## 2023-09-17 ENCOUNTER — Other Ambulatory Visit

## 2023-09-17 DIAGNOSIS — Z006 Encounter for examination for normal comparison and control in clinical research program: Secondary | ICD-10-CM

## 2023-09-18 ENCOUNTER — Other Ambulatory Visit: Payer: Self-pay

## 2023-09-19 DIAGNOSIS — M9902 Segmental and somatic dysfunction of thoracic region: Secondary | ICD-10-CM | POA: Diagnosis not present

## 2023-09-19 DIAGNOSIS — M9903 Segmental and somatic dysfunction of lumbar region: Secondary | ICD-10-CM | POA: Diagnosis not present

## 2023-09-19 DIAGNOSIS — M9905 Segmental and somatic dysfunction of pelvic region: Secondary | ICD-10-CM | POA: Diagnosis not present

## 2023-09-19 DIAGNOSIS — M5451 Vertebrogenic low back pain: Secondary | ICD-10-CM | POA: Diagnosis not present

## 2023-09-23 DIAGNOSIS — H2513 Age-related nuclear cataract, bilateral: Secondary | ICD-10-CM | POA: Diagnosis not present

## 2023-09-23 DIAGNOSIS — D3132 Benign neoplasm of left choroid: Secondary | ICD-10-CM | POA: Diagnosis not present

## 2023-09-25 DIAGNOSIS — H9193 Unspecified hearing loss, bilateral: Secondary | ICD-10-CM | POA: Diagnosis not present

## 2023-09-25 DIAGNOSIS — H6123 Impacted cerumen, bilateral: Secondary | ICD-10-CM | POA: Diagnosis not present

## 2023-10-02 DIAGNOSIS — Z23 Encounter for immunization: Secondary | ICD-10-CM | POA: Diagnosis not present

## 2023-10-02 DIAGNOSIS — M9902 Segmental and somatic dysfunction of thoracic region: Secondary | ICD-10-CM | POA: Diagnosis not present

## 2023-10-02 DIAGNOSIS — M9903 Segmental and somatic dysfunction of lumbar region: Secondary | ICD-10-CM | POA: Diagnosis not present

## 2023-10-02 DIAGNOSIS — M5451 Vertebrogenic low back pain: Secondary | ICD-10-CM | POA: Diagnosis not present

## 2023-10-02 DIAGNOSIS — M9905 Segmental and somatic dysfunction of pelvic region: Secondary | ICD-10-CM | POA: Diagnosis not present

## 2023-10-04 LAB — GENECONNECT MOLECULAR SCREEN: Genetic Analysis Overall Interpretation: NEGATIVE

## 2023-10-15 DIAGNOSIS — M9905 Segmental and somatic dysfunction of pelvic region: Secondary | ICD-10-CM | POA: Diagnosis not present

## 2023-10-15 DIAGNOSIS — M5451 Vertebrogenic low back pain: Secondary | ICD-10-CM | POA: Diagnosis not present

## 2023-10-15 DIAGNOSIS — M9902 Segmental and somatic dysfunction of thoracic region: Secondary | ICD-10-CM | POA: Diagnosis not present

## 2023-10-15 DIAGNOSIS — M9903 Segmental and somatic dysfunction of lumbar region: Secondary | ICD-10-CM | POA: Diagnosis not present

## 2023-10-30 DIAGNOSIS — M9903 Segmental and somatic dysfunction of lumbar region: Secondary | ICD-10-CM | POA: Diagnosis not present

## 2023-10-30 DIAGNOSIS — M9902 Segmental and somatic dysfunction of thoracic region: Secondary | ICD-10-CM | POA: Diagnosis not present

## 2023-10-30 DIAGNOSIS — M5451 Vertebrogenic low back pain: Secondary | ICD-10-CM | POA: Diagnosis not present

## 2023-10-30 DIAGNOSIS — M9905 Segmental and somatic dysfunction of pelvic region: Secondary | ICD-10-CM | POA: Diagnosis not present

## 2023-12-03 DIAGNOSIS — D1809 Hemangioma of other sites: Secondary | ICD-10-CM | POA: Diagnosis not present

## 2023-12-17 DIAGNOSIS — H2512 Age-related nuclear cataract, left eye: Secondary | ICD-10-CM | POA: Diagnosis not present

## 2023-12-17 DIAGNOSIS — Z961 Presence of intraocular lens: Secondary | ICD-10-CM | POA: Diagnosis not present

## 2023-12-17 DIAGNOSIS — H25812 Combined forms of age-related cataract, left eye: Secondary | ICD-10-CM | POA: Diagnosis not present

## 2023-12-31 DIAGNOSIS — H25811 Combined forms of age-related cataract, right eye: Secondary | ICD-10-CM | POA: Diagnosis not present

## 2023-12-31 DIAGNOSIS — H2511 Age-related nuclear cataract, right eye: Secondary | ICD-10-CM | POA: Diagnosis not present

## 2023-12-31 DIAGNOSIS — Z961 Presence of intraocular lens: Secondary | ICD-10-CM | POA: Diagnosis not present
